# Patient Record
Sex: Female | Born: 1970 | Hispanic: No | Smoking: Former smoker
Health system: Southern US, Community
[De-identification: ages and names within clinical notes are randomized; demographics above are authoritative.]

## PROBLEM LIST (undated history)

## (undated) DIAGNOSIS — F419 Anxiety disorder, unspecified: Secondary | ICD-10-CM

## (undated) DIAGNOSIS — E119 Type 2 diabetes mellitus without complications: Secondary | ICD-10-CM

## (undated) DIAGNOSIS — R011 Cardiac murmur, unspecified: Secondary | ICD-10-CM

## (undated) DIAGNOSIS — R609 Edema, unspecified: Secondary | ICD-10-CM

## (undated) DIAGNOSIS — E785 Hyperlipidemia, unspecified: Secondary | ICD-10-CM

## (undated) DIAGNOSIS — D219 Benign neoplasm of connective and other soft tissue, unspecified: Secondary | ICD-10-CM

## (undated) DIAGNOSIS — D649 Anemia, unspecified: Secondary | ICD-10-CM

## (undated) HISTORY — PX: OVARIAN CYST REMOVAL: SHX89

## (undated) HISTORY — PX: WISDOM TOOTH EXTRACTION: SHX21

## (undated) HISTORY — DX: Hyperlipidemia, unspecified: E78.5

## (undated) HISTORY — DX: Benign neoplasm of connective and other soft tissue, unspecified: D21.9

## (undated) HISTORY — DX: Type 2 diabetes mellitus without complications: E11.9

---

## 2011-05-12 ENCOUNTER — Other Ambulatory Visit (HOSPITAL_COMMUNITY): Payer: Self-pay | Admitting: Obstetrics & Gynecology

## 2011-05-12 DIAGNOSIS — Z1231 Encounter for screening mammogram for malignant neoplasm of breast: Secondary | ICD-10-CM

## 2011-05-14 ENCOUNTER — Ambulatory Visit (HOSPITAL_COMMUNITY)
Admission: RE | Admit: 2011-05-14 | Discharge: 2011-05-14 | Disposition: A | Discharge status: Home / Self Care | Payer: Self-pay | Source: Ambulatory Visit | Attending: Obstetrics & Gynecology | Admitting: Obstetrics & Gynecology

## 2011-05-14 DIAGNOSIS — Z1231 Encounter for screening mammogram for malignant neoplasm of breast: Secondary | ICD-10-CM

## 2011-05-18 ENCOUNTER — Other Ambulatory Visit: Payer: Self-pay | Admitting: Obstetrics & Gynecology

## 2011-05-18 DIAGNOSIS — R928 Other abnormal and inconclusive findings on diagnostic imaging of breast: Secondary | ICD-10-CM

## 2011-05-19 ENCOUNTER — Telehealth: Payer: Self-pay | Admitting: *Deleted

## 2011-05-19 NOTE — Telephone Encounter (Signed)
Patient phoned into office at approximately 3:45 pm.  Patient requested appointment.  Patient call was sent to me after patient had been verbally abusive to front office staff member, Sherry Ruffing.  I attempted to help patient with scheduling an appointment.  Patient states she was seen by Dr. Marice Potter at the cervical cancer screening on Monday at [facility].  Patient states Dr. Marice Potter told her to call this number to schedule an appointment and we would know exactly what to do.  Patient states she is having bleeding issues and Dr. Marice Potter told her she would need some blood work.  Both Antoinette and myself attempted to determine if the patient was to have the lab work drawn prior to the appointment or on the same day.  The patient kept repeating that Dr. Marice Potter told her to call this number and we would know what she needed.  Patient stated "You are no better than the one who was on the phone before.  Are you stupid too?  I don't understand why there is all this drama.  I'm doing what Dr. Marice Potter told me to do."  I was finally able to schedule the patient an appointment with Dr. Marice Potter.  I explained to the patient that if Dr. Marice Potter wanted her to have lab work that it could be drawn at that appointment.  Patient proceeds to state that she does not have insurance and that Dr. Marice Potter told her to let us know that.  I explained to the patient that she would be expected to make a payment of $20 at the time of the appointment and we could then give her an application to apply for charity care with the hospital.  I explained that she may qualify for a discount on the services she receives.  The patient questioned what would happen if she did not qualify for a discount and wanted to know if she would be responsible for the bill.  I explained that she would be responsible for the bill.  Patient asked what would happen if she couldn't pay the bill.  I explained she would be turned over to collections if she didn't pay the bill.  Patient wanted to  know if I could tell her how much the charge would be to be seen.  I explained the charges would depend on what the doctor ordered for her.  She asked how she was to know whether our rates were comparable to other providers.  I explained that our charges were the same as if she went to any other provider in the community and that we are not a free clinic.  Patient states that if she is turned over to collections she will just tell them that Dr. Marice Potter told her to call this number.  She then asked if I would send her an application to apply for charity care through the mail.  I explained that I would do that but could not promise that it would be processed by the time of her appointment.  I further explained that usually the applications are not processed until the patient has been seen and has a balance.  The patient stated "Well that makes me damned if I do and damned if I don't."  I confirmed patient's address and reinforced that an application would be sent to her address in the mail today.

## 2011-05-22 ENCOUNTER — Inpatient Hospital Stay (HOSPITAL_COMMUNITY)
Admission: AD | Admit: 2011-05-22 | Discharge: 2011-05-22 | Disposition: A | Discharge status: Home / Self Care | Payer: Self-pay | Source: Ambulatory Visit | Attending: Family Medicine | Admitting: Family Medicine

## 2011-05-22 ENCOUNTER — Encounter (HOSPITAL_COMMUNITY): Payer: Self-pay

## 2011-05-22 DIAGNOSIS — N920 Excessive and frequent menstruation with regular cycle: Secondary | ICD-10-CM | POA: Insufficient documentation

## 2011-05-22 LAB — CBC
HCT: 33.8 % — ABNORMAL LOW (ref 36.0–46.0)
Hemoglobin: 11.1 g/dL — ABNORMAL LOW (ref 12.0–15.0)
MCH: 27.3 pg (ref 26.0–34.0)
MCHC: 32.8 g/dL (ref 30.0–36.0)
MCV: 83.3 fL (ref 78.0–100.0)
RDW: 14.4 % (ref 11.5–15.5)

## 2011-05-22 LAB — POCT PREGNANCY, URINE: Preg Test, Ur: NEGATIVE

## 2011-05-22 MED ORDER — MEGESTROL ACETATE 40 MG PO TABS: 120.0000 mg | ORAL_TABLET | Freq: Every day | ORAL | Status: AC

## 2011-05-22 NOTE — Discharge Instructions (Signed)
Abnormal Uterine Bleeding Abnormal uterine bleeding can have many causes. Some cases are simply treated, while others are more serious. There are several kinds of bleeding that is considered abnormal, including:  Bleeding between periods.   Bleeding after sexual intercourse.   Spotting anytime in the menstrual cycle.   Bleeding heavier or more than normal.   Bleeding after menopause.  CAUSES  There are many causes of abnormal uterine bleeding. It can be present in teenagers, pregnant women, women during their reproductive years, and women who have reached menopause. Your caregiver will look for the more common causes depending on your age, signs, symptoms and your particular circumstance. Most cases are not serious and can be treated. Even the more serious causes, like cancer of the female organs, can be treated adequately if found in the early stages. That is why all types of bleeding should be evaluated and treated as soon as possible. DIAGNOSIS  Diagnosing the cause may take several kinds of tests. Your caregiver may:  Take a complete history of the type of bleeding.   Perform a complete physical exam and Pap smear.   Take an ultrasound on the abdomen showing a picture of the female organs and the pelvis.   Inject dye into the uterus and Fallopian tubes and X-ray them (hysterosalpingogram).   Place fluid in the uterus and do an ultrasound (sonohysterogrqphy).   Take a CT scan to examine the female organs and pelvis.   Take an MRI to examine the female organs and pelvis. There is no X-ray involved with this procedure.   Look inside the uterus with a telescope that has a light at the end (hysteroscopy).   Scrap the inside of the uterus to get tissue to examine (Dilatation and Curettage, D&C).   Look into the pelvis with a telescope that has a light at the end (laparoscopy). This is done through a very small cut (incision) in the abdomen.  TREATMENT  Treatment will depend on the  cause of the abnormal bleeding. It can include:  Doing nothing to allow the problem to take care of itself over time.   Hormone treatment.   Birth control pills.   Treating the medical condition causing the problem.   Laparoscopy.   Major or minor surgery   Destroying the lining of the uterus with electrical currant, laser, freezing or heat (uterine ablation).  HOME CARE INSTRUCTIONS   Follow your caregiver's recommendation on how to treat your problem.   See your caregiver if you missed a menstrual period and think you may be pregnant.   If you are bleeding heavily, count the number of pads/tampons you use and how often you have to change them. Tell this to your caregiver.   Avoid sexual intercourse until the problem is controlled.  SEEK MEDICAL CARE IF:   You have any kind of abnormal bleeding mentioned above.   You feel dizzy at times.   You are 41 years old and have not had a menstrual period yet.  SEEK IMMEDIATE MEDICAL CARE IF:   You pass out.   You are changing pads/tampons every 15 to 30 minutes.   You have belly (abdominal) pain.   You have a temperature of 100 F (37.8 C) or higher.   You become sweaty or weak.   You are passing large blood clots from the vagina.   You start to feel sick to your stomach (nauseous) and throw up (vomit).  Document Released: 02/22/2005 Document Revised: 02/11/2011 Document Reviewed: 07/18/2008 Exit[employer]   Patient Information 2012 [employer]. 

## 2011-05-22 NOTE — ED Provider Notes (Signed)
@  MAUPATCONTACT@  Chief Complaint:  Vaginal Bleeding   Tina Sloan is  41 y.o. G2P0020.  Patient's last menstrual period was 04/24/2011.Marland Kitchen  Her pregnancy status is negative.  She presents complaining of Vaginal Bleeding . Onset is described as sudden and has been present for  2 days.  Pt has been having very heavy periods for over a year, but this one is "especially heavy".  She received a PAP test a few weeks ago and [facility]   Past Medical History  Diagnosis Date  . Endometriosis     Past Surgical History  Procedure Date  . Ovarian cyst removal     No family history on file.  History  Substance Use Topics  . Smoking status: Not on file  . Smokeless tobacco: Not on file  . Alcohol Use: Not on file    Allergies: No Known Allergies  Prescriptions prior to admission  Medication Sig Dispense Refill  . Multiple Vitamins-Minerals (MULTIVITAMIN) LIQD Take 1 scoop by mouth daily.      . naproxen (NAPROSYN) 500 MG tablet Take 500 mg by mouth 2 (two) times daily as needed. Pain associated with period        Review of Systems - Negative except heavy periods  Physical Exam   Blood pressure 141/91, pulse 86, temperature 98.8 F (37.1 C), temperature source Oral, resp. rate 18, last menstrual period 04/24/2011.  General: General appearance - alert, well appearing, and in no distress Chest - clear to auscultation, no wheezes, rales or rhonchi, symmetric air entry Heart - normal rate and regular rhythm Abdomen - soft, nontender, nondistended, no masses or organomegaly Pelvic - normal external genitalia, vulva, vagina, cervix.  Moderate amount dark blood in the vagina, cervix no-friable Extremities - no pedal edema noted   Labs: Recent Results (from the past 24 hour(s))  POCT PREGNANCY, URINE   Collection Time   05/22/11  2:07 PM      Component Value Range   Preg Test, Ur NEGATIVE  NEGATIVE   CBC   Collection Time   05/22/11  3:32 PM      Component Value Range   WBC 9.2  4.0 - 10.5 (K/uL)   RBC 4.06  3.87 - 5.11 (MIL/uL)   Hemoglobin 11.1 (*) 12.0 - 15.0 (g/dL)   HCT 04.5 (*) 40.9 - 46.0 (%)   MCV 83.3  78.0 - 100.0 (fL)   MCH 27.3  26.0 - 34.0 (pg)   MCHC 32.8  30.0 - 36.0 (g/dL)   RDW 81.1  91.4 - 78.2 (%)   Platelets 265  150 - 400 (K/uL)     Assessment: There is no problem list on file for this patient. Metromennorhagia  Plan: Pt doesn't want to wait for an ultrasound, so it will be ordered outpatient and f/u with GYN clinic  CRESENZO-DISHMAN,Mayline Dragon

## 2011-05-22 NOTE — MAU Note (Signed)
Patient has been having abnormal periods for one year, started her period yesterday bleeding heavy and passing clots has went through many pads in 24 hours,history of ovarian cyst with endometriosis [facility] was treated for trich, went for cancer screening pap at [facility] was seen for mammogram and has to have a screening of right breast.

## 2011-05-22 NOTE — ED Provider Notes (Signed)
Chart reviewed and agree with management and plan.  

## 2011-05-24 ENCOUNTER — Other Ambulatory Visit: Payer: Self-pay

## 2011-06-15 ENCOUNTER — Ambulatory Visit (INDEPENDENT_AMBULATORY_CARE_PROVIDER_SITE_OTHER): Payer: Self-pay | Admitting: *Deleted

## 2011-06-15 VITALS — BP 158/100 | HR 88 | Temp 98.0°F | Ht 63.5 in | Wt 164.5 lb

## 2011-06-15 DIAGNOSIS — Z1239 Encounter for other screening for malignant neoplasm of breast: Secondary | ICD-10-CM

## 2011-06-15 NOTE — Patient Instructions (Signed)
Taught patient how to perform BSE and gave educational materials to take home. Patient did not need a Pap smear today due to last Pap smear was 05/17/11. Patient has already been treated for the Trichomonas infection by [facility]. Patient referred to the [facility] for right breat diagnostic mammogram and possible right breast ultrasound. Appointment scheduled Thursday, June 17, 2011 at 1440. Let patient know will follow up with her within the next couple weeks with results. Patient verbalized understanding.

## 2011-06-15 NOTE — Progress Notes (Signed)
Patient referred from the [facility] due to needing additional imaging of the right breast. Screening mammogram was completed 05/14/11.  Pap Smear: Pap smear not performed today. Patients last Pap smear was 05/17/11 at the free Pap smear screening at [facility] by the [facility]. Pap smear was normal showing the Trichomonas infection. Per patient she has no history of abnormal Pap smears. No Pap smear results in EPIC. Patient has already been treated for Trich[facility] by [facility].  Physical exam:  Breasts  Breasts symmetrical. No skin abnormalities bilateral breasts. No nipple retraction bilateral breasts. No nipple discharge bilateral breasts. No lymphadenopathy. No lumps palpated bilateral breasts. No complaints of pain or tenderness on exam. Patient referred to the [facility] for right breast diagnostic mammogram and possible right breast ultrasound. Appointment scheduled for Thursday, June 17, 2011 at 1440.  Pelvic/Bimanual  No Pap smear completed today since last Pap smear was 05/17/11. Pap smear not indicated per BCCCP guidelines.

## 2011-06-17 ENCOUNTER — Ambulatory Visit
Admission: RE | Admit: 2011-06-17 | Discharge: 2011-06-17 | Disposition: A | Discharge status: Home / Self Care | Payer: No Typology Code available for payment source | Source: Ambulatory Visit | Attending: Obstetrics & Gynecology | Admitting: Obstetrics & Gynecology

## 2011-06-17 DIAGNOSIS — R928 Other abnormal and inconclusive findings on diagnostic imaging of breast: Secondary | ICD-10-CM

## 2011-06-25 ENCOUNTER — Ambulatory Visit: Payer: Self-pay | Admitting: Obstetrics & Gynecology

## 2011-07-06 ENCOUNTER — Telehealth: Payer: Self-pay

## 2011-07-06 NOTE — Telephone Encounter (Signed)
Called pt and left message to return call to Fonnie Mu @ [phone].

## 2011-07-07 ENCOUNTER — Telehealth: Payer: Self-pay | Admitting: *Deleted

## 2011-07-07 NOTE — Telephone Encounter (Signed)
Patient returned my phone call. Verified that patient has received her results to her diagnostic mammogram. Patient is aware that it was negative. Let her know she will need a screening mammogram in 1 year. Let patient know if still eligible for BCCCP can call next year to set up appointment. Patient verbalized understanding.

## 2011-07-22 ENCOUNTER — Ambulatory Visit: Payer: Self-pay | Admitting: Obstetrics & Gynecology

## 2011-07-29 ENCOUNTER — Ambulatory Visit: Payer: Self-pay | Admitting: Obstetrics & Gynecology

## 2012-06-12 ENCOUNTER — Other Ambulatory Visit: Payer: Self-pay | Admitting: Obstetrics and Gynecology

## 2012-06-12 DIAGNOSIS — Z1231 Encounter for screening mammogram for malignant neoplasm of breast: Secondary | ICD-10-CM

## 2012-06-20 ENCOUNTER — Encounter (HOSPITAL_COMMUNITY): Payer: Self-pay

## 2012-06-20 ENCOUNTER — Ambulatory Visit (HOSPITAL_COMMUNITY)
Admission: RE | Admit: 2012-06-20 | Discharge: 2012-06-20 | Disposition: A | Discharge status: Home / Self Care | Payer: Self-pay | Source: Ambulatory Visit | Attending: Obstetrics and Gynecology | Admitting: Obstetrics and Gynecology

## 2012-06-20 VITALS — BP 110/74 | Temp 98.3°F | Ht 64.0 in | Wt 151.6 lb

## 2012-06-20 DIAGNOSIS — Z1231 Encounter for screening mammogram for malignant neoplasm of breast: Secondary | ICD-10-CM

## 2012-06-20 DIAGNOSIS — Z1239 Encounter for other screening for malignant neoplasm of breast: Secondary | ICD-10-CM

## 2012-06-20 NOTE — Progress Notes (Addendum)
Complaints of bilateral breast tenderness.  Pap Smear:  Pap smear not performed today. Patients last Pap smear was 05/17/2011 at the free Cervical Cancer Screening at [facility] and normal. Per patient she has no history of an abnormal Pap smear. No Pap smear results in EPIC.  Physical exam: Breasts Breasts symmetrical. No skin abnormalities bilateral breasts. No nipple retraction bilateral breasts. No nipple discharge bilateral breasts. No lymphadenopathy. No lumps palpated bilateral breasts. No complaints of pain or tenderness on exam. Patient escorted to mammography for a screening mammogram.         Pelvic/Bimanual No Pap smear completed today since last Pap smear was 05/17/2011 and normal. Pap smear not indicated per BCCCP guidelines.

## 2012-06-20 NOTE — Patient Instructions (Signed)
Reviewed with patient how to perform BSE. Patient did not need a Pap smear today due to last Pap smear was 05/17/2011. Told patient about free cervical cancer screenings to receive a Pap smear if would like one next year. Let her know BCCCP will cover Pap smears every 3 years unless has a history of abnormal Pap smears. Patient complained of AUB. Gave her phone number to [facility] to call to schedule an appointment. Patient preferred to call instead of [place] referring. Let patient know will follow up with her within the next couple weeks with results by letter or phone for mammogram. Patient verbalized understanding.

## 2013-06-05 ENCOUNTER — Other Ambulatory Visit (HOSPITAL_COMMUNITY): Payer: Self-pay | Admitting: Obstetrics and Gynecology

## 2013-06-05 DIAGNOSIS — Z1231 Encounter for screening mammogram for malignant neoplasm of breast: Secondary | ICD-10-CM

## 2013-06-21 ENCOUNTER — Ambulatory Visit (HOSPITAL_COMMUNITY): Payer: Self-pay

## 2013-06-22 ENCOUNTER — Ambulatory Visit (HOSPITAL_COMMUNITY): Payer: Self-pay

## 2013-06-25 ENCOUNTER — Ambulatory Visit (HOSPITAL_COMMUNITY): Admission: RE | Admit: 2013-06-25 | Payer: Self-pay | Source: Ambulatory Visit

## 2013-06-26 ENCOUNTER — Ambulatory Visit (HOSPITAL_COMMUNITY)
Admission: RE | Admit: 2013-06-26 | Discharge: 2013-06-26 | Disposition: A | Discharge status: Home / Self Care | Payer: No Typology Code available for payment source | Source: Ambulatory Visit | Attending: Obstetrics and Gynecology | Admitting: Obstetrics and Gynecology

## 2013-06-26 DIAGNOSIS — Z1231 Encounter for screening mammogram for malignant neoplasm of breast: Secondary | ICD-10-CM | POA: Insufficient documentation

## 2013-10-12 ENCOUNTER — Other Ambulatory Visit: Payer: No Typology Code available for payment source

## 2013-10-12 ENCOUNTER — Ambulatory Visit
Admission: RE | Admit: 2013-10-12 | Discharge: 2013-10-12 | Disposition: A | Discharge status: Home / Self Care | Payer: No Typology Code available for payment source | Source: Ambulatory Visit | Attending: Obstetrics & Gynecology | Admitting: Obstetrics & Gynecology

## 2013-10-12 ENCOUNTER — Other Ambulatory Visit: Payer: Self-pay | Admitting: Obstetrics & Gynecology

## 2013-10-12 DIAGNOSIS — T8332XS Displacement of intrauterine contraceptive device, sequela: Secondary | ICD-10-CM

## 2013-10-12 DIAGNOSIS — R102 Pelvic and perineal pain: Secondary | ICD-10-CM

## 2013-10-12 DIAGNOSIS — R109 Unspecified abdominal pain: Secondary | ICD-10-CM

## 2014-01-07 ENCOUNTER — Encounter (HOSPITAL_COMMUNITY): Payer: Self-pay

## 2014-04-18 ENCOUNTER — Telehealth: Payer: Self-pay | Admitting: Cardiology

## 2014-04-18 NOTE — Telephone Encounter (Signed)
Received records from [facility] for appointment with Dr Percival Spanish on 04/26/14.  Records given to [facility] (medical records) for Dr Hochrein's schedule on 04/26/14. lp

## 2014-04-26 ENCOUNTER — Ambulatory Visit (INDEPENDENT_AMBULATORY_CARE_PROVIDER_SITE_OTHER): Payer: 59 | Admitting: Cardiology

## 2014-04-26 ENCOUNTER — Encounter: Payer: Self-pay | Admitting: Cardiology

## 2014-04-26 VITALS — BP 148/98 | HR 68 | Ht 63.0 in | Wt 159.0 lb

## 2014-04-26 DIAGNOSIS — R011 Cardiac murmur, unspecified: Secondary | ICD-10-CM

## 2014-04-26 NOTE — Progress Notes (Addendum)
Cardiology Office Note   Date:  04/26/2014   ID:  Garima Sloan, DOB 11/16/1970, MRN [id number]  PCP:  No primary care provider on file.  Cardiologist:   Minus Breeding, MD   Murmur    History of Present Illness: Tina Sloan is a 44 y.o. female who presents for evaluation of a heart murmur. She also has some fluid retention. She is to undergo surgery for some GYN issues and this is a preoperative evaluation. She was noted preoperatively to have heart murmur. She has no prior cardiac history though she does have a family history of early heart disease. She did have diabetes at one point in time we treated this with 25 pounds of weight loss. She has significant discomfort related to GYN issues. She does report that she retains fluid and her weight can fluctuate by 3 or 4 pounds in a day. However, she does not have dyspnea. She does not describe PND or orthopnea. She does not have chest pressure, neck or arm discomfort.     Past Medical History  Diagnosis Date  . Endometriosis   . Hyperlipidemia     Past Surgical History  Procedure Laterality Date  . Ovarian cyst removal       Current Outpatient Prescriptions  Medication Sig Dispense Refill  . Biotin 1000 MCG tablet Take 1,000 mcg by mouth 3 (three) times daily.    . Cyanocobalamin (VITAMIN B 12 PO) Take by mouth.    . Iron-Vitamin C (IRON 100/C PO) Take 100 mg by mouth.    Marland Kitchen VITAMIN D, CHOLECALCIFEROL, PO Take by mouth.    Marland Kitchen ibuprofen (ADVIL,MOTRIN) 600 MG tablet 600 mg.  2  . Multiple Vitamins-Minerals (MULTIVITAMIN) LIQD Take 1 scoop by mouth daily.    . naproxen (NAPROSYN) 500 MG tablet Take 500 mg by mouth 2 (two) times daily as needed. Pain associated with period     No current facility-administered medications for this visit.    Allergies:   Review of patient's allergies indicates no known allergies.    Social History:  The patient  reports that she has never smoked. She has never used smokeless tobacco. She  reports that she does not drink alcohol or use illicit drugs.   Family History:  The patient's family history includes Diabetes in her father and mother; Heart disease in her father; Hypertension in her father and mother.    ROS:  Please see the history of present illness.   Otherwise, review of systems are positive for none.   All other systems are reviewed and negative.    PHYSICAL EXAM: VS:  Ht 5\' 3"  (1.6 m)  Wt 159 lb (72.122 kg)  BMI 28.17 kg/m2 , BMI Body mass index is 28.17 kg/(m^2). GENERAL:  Well appearing HEENT:  Pupils equal round and reactive, fundi not visualized, oral mucosa unremarkable NECK:  No jugular venous distention, waveform within normal limits, carotid upstroke brisk and symmetric, no bruits, no thyromegaly LYMPHATICS:  No cervical, inguinal adenopathy LUNGS:  Clear to auscultation bilaterally BACK:  No CVA tenderness CHEST:  Unremarkable HEART:  PMI not displaced or sustained,S1 and S2 within normal limits, no S3, no S4, no clicks, no rubs, 2 out of 6 left upper sternal border early systolic murmur not increasing with the strain phase of Valsalva, no diastolic murmurs ABD:  Flat, positive bowel sounds normal in frequency in pitch, no bruits, no rebound, no guarding, no midline pulsatile mass, no hepatomegaly, no splenomegaly EXT:  2 plus pulses throughout,  no edema, no cyanosis no clubbing SKIN:  No rashes no nodules NEURO:  Cranial nerves II through XII grossly intact, motor grossly intact throughout PSYCH:  Cognitively intact, oriented to person place and time    EKG:  EKG is ordered today. The ekg ordered today demonstrates sinus rhythm, rate 68, axis within normal limits, intervals within normal limits, no acute ST-T wave changes.   Recent Labs: No results found for requested labs within last 365 days.    Lipid Panel No results found for: CHOL, TRIG, HDL, CHOLHDL, VLDL, LDLCALC, LDLDIRECT    Wt Readings from Last 3 Encounters:  04/26/14 159 lb  (72.122 kg)  06/15/11 164 lb 8 oz (74.617 kg)      Other studies Reviewed: Additional studies/ records that were reviewed today include:  Outside records. Review of the above records demonstrates:  Please see elsewhere in the note.   ASSESSMENT AND PLAN:  MURMUR:  I suspect a flow murmur but I will check an echocardiogram. Further evaluation will be based on the results of this.  PALPITATIONS:  She has some rare isolated skipped beats. I suspect PACs or PVCs. We discussed these. No change in therapy is indicated.  HTN:  Her blood pressure is mildly elevated but she says that this happens when she becomes anxious. We talked about therapeutic lifestyle changes to include exercise and salt restriction.  VOLUME RETENTION:  I do not suspect any heart failure. We did talk about salt restriction. This may be related to her GYN issues. No further evaluation is indicated other than the echo as above.   Current medicines are reviewed at length with the patient today.  The patient does not have concerns regarding medicines.  The following changes have been made:  no change  Labs/ tests ordered today include: echo Orders Placed This Encounter  Procedures  . EKG 12-Lead     Disposition:   FU with me as needed.     Signed, Minus Breeding, MD  04/26/2014 11:53 AM    [facility]

## 2014-04-26 NOTE — Patient Instructions (Signed)
Your physician recommends that you schedule a follow-up appointment in: as needed with Dr. Percival Spanish  We are ordering an echo for you to get done

## 2014-04-29 ENCOUNTER — Inpatient Hospital Stay (HOSPITAL_COMMUNITY): Admission: RE | Admit: 2014-04-29 | Payer: 59 | Source: Ambulatory Visit

## 2014-05-22 ENCOUNTER — Inpatient Hospital Stay (HOSPITAL_COMMUNITY): Admission: RE | Admit: 2014-05-22 | Payer: 59 | Source: Ambulatory Visit

## 2014-06-11 ENCOUNTER — Other Ambulatory Visit (HOSPITAL_COMMUNITY): Payer: Self-pay | Admitting: Internal Medicine

## 2014-06-11 DIAGNOSIS — Z1231 Encounter for screening mammogram for malignant neoplasm of breast: Secondary | ICD-10-CM

## 2014-06-28 ENCOUNTER — Ambulatory Visit (HOSPITAL_COMMUNITY): Payer: 59

## 2014-07-05 ENCOUNTER — Other Ambulatory Visit: Payer: Self-pay | Admitting: Obstetrics & Gynecology

## 2014-07-05 DIAGNOSIS — D259 Leiomyoma of uterus, unspecified: Secondary | ICD-10-CM

## 2014-07-09 ENCOUNTER — Other Ambulatory Visit: Payer: 59

## 2014-07-12 ENCOUNTER — Ambulatory Visit (HOSPITAL_COMMUNITY)
Admission: RE | Admit: 2014-07-12 | Discharge: 2014-07-12 | Disposition: A | Discharge status: Home / Self Care | Payer: 59 | Source: Ambulatory Visit | Attending: Internal Medicine | Admitting: Internal Medicine

## 2014-07-12 DIAGNOSIS — Z1231 Encounter for screening mammogram for malignant neoplasm of breast: Secondary | ICD-10-CM | POA: Insufficient documentation

## 2015-01-31 ENCOUNTER — Ambulatory Visit (HOSPITAL_COMMUNITY): Payer: 59

## 2015-07-03 ENCOUNTER — Encounter (HOSPITAL_COMMUNITY): Payer: Self-pay

## 2015-07-03 ENCOUNTER — Inpatient Hospital Stay (HOSPITAL_COMMUNITY): Payer: BLUE CROSS/BLUE SHIELD

## 2015-07-03 ENCOUNTER — Inpatient Hospital Stay (HOSPITAL_COMMUNITY)
Admission: AD | Admit: 2015-07-03 | Discharge: 2015-07-03 | Disposition: A | Discharge status: Home / Self Care | Payer: BLUE CROSS/BLUE SHIELD | Source: Ambulatory Visit | Attending: Obstetrics & Gynecology | Admitting: Obstetrics & Gynecology

## 2015-07-03 DIAGNOSIS — I10 Essential (primary) hypertension: Secondary | ICD-10-CM | POA: Diagnosis not present

## 2015-07-03 DIAGNOSIS — N390 Urinary tract infection, site not specified: Secondary | ICD-10-CM

## 2015-07-03 DIAGNOSIS — M545 Low back pain: Secondary | ICD-10-CM | POA: Diagnosis not present

## 2015-07-03 DIAGNOSIS — N289 Disorder of kidney and ureter, unspecified: Secondary | ICD-10-CM | POA: Diagnosis not present

## 2015-07-03 DIAGNOSIS — D259 Leiomyoma of uterus, unspecified: Secondary | ICD-10-CM | POA: Insufficient documentation

## 2015-07-03 DIAGNOSIS — R1030 Lower abdominal pain, unspecified: Secondary | ICD-10-CM | POA: Insufficient documentation

## 2015-07-03 DIAGNOSIS — N809 Endometriosis, unspecified: Secondary | ICD-10-CM | POA: Insufficient documentation

## 2015-07-03 DIAGNOSIS — R809 Proteinuria, unspecified: Secondary | ICD-10-CM | POA: Insufficient documentation

## 2015-07-03 DIAGNOSIS — R319 Hematuria, unspecified: Secondary | ICD-10-CM | POA: Diagnosis not present

## 2015-07-03 DIAGNOSIS — Z87891 Personal history of nicotine dependence: Secondary | ICD-10-CM | POA: Insufficient documentation

## 2015-07-03 DIAGNOSIS — R109 Unspecified abdominal pain: Secondary | ICD-10-CM

## 2015-07-03 LAB — URINALYSIS, ROUTINE W REFLEX MICROSCOPIC
Bilirubin Urine: NEGATIVE
GLUCOSE, UA: NEGATIVE mg/dL
Ketones, ur: NEGATIVE mg/dL
Leukocytes, UA: NEGATIVE
Nitrite: NEGATIVE
Protein, ur: >300 mg/dL — AB
SPECIFIC GRAVITY, URINE: 1.015 (ref 1.005–1.030)
pH: 6 (ref 5.0–8.0)

## 2015-07-03 LAB — URINE MICROSCOPIC-ADD ON
BACTERIA UA: NONE SEEN
WBC, UA: NONE SEEN WBC/hpf (ref 0–5)

## 2015-07-03 MED ORDER — HYDROCHLOROTHIAZIDE 25 MG PO TABS: 25.0000 mg | ORAL_TABLET | Freq: Every day | ORAL | Status: DC

## 2015-07-03 MED ORDER — KETOROLAC TROMETHAMINE 60 MG/2ML IM SOLN
60.0000 mg | Freq: Once | INTRAMUSCULAR | Status: AC
  Administered 2015-07-03: 60 mg via INTRAMUSCULAR
  Filled 2015-07-03: qty 2

## 2015-07-03 MED ORDER — IBUPROFEN 600 MG PO TABS: 600.0000 mg | ORAL_TABLET | Freq: Four times a day (QID) | ORAL | Status: DC | PRN

## 2015-07-03 MED ORDER — SULFAMETHOXAZOLE-TRIMETHOPRIM 800-160 MG PO TABS: 1.0000 | ORAL_TABLET | Freq: Two times a day (BID) | ORAL | Status: DC

## 2015-07-03 NOTE — MAU Note (Signed)
Rt flank pain , started on Saturday, sharp pains, getting worse, comes around to the front.  Period ended on Saturday, but started spotting yesterday.

## 2015-07-03 NOTE — MAU Note (Signed)
Urine sent to lab 

## 2015-07-03 NOTE — MAU Provider Note (Signed)
Chief Complaint:  Flank Pain   First Provider Initiated Contact with Patient 07/03/15 1525      HPI  Tina Sloan is a 45 y.o. [id number] who presents to maternity admissions reporting right low back pain which wraps around to the front. Denies dysuria or fever. Thinks it is a kidney stone and wants a CT scan.  Just got insurance and plans care with [facility] but has not been there yet.  Does not have a primary doctor. She reports no vaginal bleeding, vaginal itching/burning, h/a, dizziness, n/v, or fever/chills.    History is remarkable for labile hypertension, fibroids, and endometriosis.States was in a fibroid study and they told her she has 4.  Was supposed to have surgery with [facility] but "chickened out" and never went back to office. States was in a hypertension study and they found she really did not have hypertension.  RN Note: Rt flank pain , started on Saturday, sharp pains, getting worse, comes around to the front. Period ended on Saturday, but started spotting yesterday.           Past Medical History: Past Medical History  Diagnosis Date  . Endometriosis   . Hyperlipidemia   . Fibroids   . Diabetes     Controlled with loss    Past obstetric history: OB History  Gravida Para Term Preterm AB SAB TAB Ectopic Multiple Living  2    2  2        # Outcome Date GA Lbr Len/2nd Weight Sex Delivery Anes PTL Lv  2 TAB           1 TAB               Past Surgical History: Past Surgical History  Procedure Laterality Date  . Ovarian cyst removal      Family History: Family History  Problem Relation Age of Onset  . Diabetes Father   . Heart disease Father     CABG, died age 35  . Hypertension Father   . Diabetes Mother   . Hypertension Mother     Social History: Social History  Substance Use Topics  . Smoking status: Former Smoker    Types: Cigarettes  . Smokeless tobacco: Never Used     Comment: Quit tobacco 2012.    Marland Kitchen Alcohol Use: No    Allergies:  No Known Allergies  Meds:  Prescriptions prior to admission  Medication Sig Dispense Refill Last Dose  . Biotin 1000 MCG tablet Take 1,000 mcg by mouth 3 (three) times daily.   Taking  . Cyanocobalamin (VITAMIN B 12 PO) Take by mouth.   Taking  . ibuprofen (ADVIL,MOTRIN) 600 MG tablet 600 mg.  2   . Iron-Vitamin C (IRON 100/C PO) Take 100 mg by mouth.   Taking  . Multiple Vitamins-Minerals (MULTIVITAMIN) LIQD Take 1 scoop by mouth daily.   Not Taking  . naproxen (NAPROSYN) 500 MG tablet Take 500 mg by mouth 2 (two) times daily as needed. Pain associated with period   Not Taking  . VITAMIN D, CHOLECALCIFEROL, PO Take by mouth.   Taking    I have reviewed patient's Past Medical Hx, Surgical Hx, Family Hx, Social Hx, medications and allergies.  ROS:  Review of Systems  Constitutional: Negative for fever, chills and fatigue.  Respiratory: Negative for shortness of breath.   Cardiovascular: Positive for leg swelling.  Gastrointestinal: Positive for abdominal pain. Negative for nausea, vomiting, diarrhea and constipation.  Genitourinary: Positive for flank pain,  menstrual problem and pelvic pain. Negative for dysuria, frequency, hematuria, vaginal bleeding, vaginal discharge and difficulty urinating.  Musculoskeletal: Positive for back pain. Negative for myalgias.  Neurological: Negative for dizziness and light-headedness.   Other systems negative  Physical Exam   Constitutional: Well-developed, well-nourished female in no acute distress.  Cardiovascular: normal rate and rhythm, no ectopy audible, S1 & S2 heard, no murmur Respiratory: normal effort, no distress. Lungs CTAB with no wheezes or crackles GI: Abd soft, moderately tender over RLQ and right lower back.  Nondistended.  No rebound, No guarding.  Bowel Sounds audible  MS: Extremities nontender, no edema, normal ROM Neurologic: Alert and oriented x 4.   Grossly nonfocal. GU: Neg CVAT. Skin:  Warm and Dry Psych:  Affect  appropriate.  PELVIC EXAM: Cervix pink, visually closed, without lesion, scant white creamy discharge, vaginal walls and external genitalia normal Bimanual exam: Cervix firm, anterior, neg CMT, uterus nontender, enlarged and firm, c/w fibroids, adnexa without tenderness, enlargement, or mass    Labs: Results for orders placed or performed during the hospital encounter of 07/03/15 (from the past 24 hour(s))  Urinalysis, Routine w reflex microscopic (not at [facility])     Status: Abnormal   Collection Time: 07/03/15  3:15 PM  Result Value Ref Range   Color, Urine YELLOW YELLOW   APPearance CLEAR CLEAR   Specific Gravity, Urine 1.015 1.005 - 1.030   pH 6.0 5.0 - 8.0   Glucose, UA NEGATIVE NEGATIVE mg/dL   Hgb urine dipstick LARGE (A) NEGATIVE   Bilirubin Urine NEGATIVE NEGATIVE   Ketones, ur NEGATIVE NEGATIVE mg/dL   Protein, ur >300 (A) NEGATIVE mg/dL   Nitrite NEGATIVE NEGATIVE   Leukocytes, UA NEGATIVE NEGATIVE  Urine microscopic-add on     Status: Abnormal   Collection Time: 07/03/15  3:15 PM  Result Value Ref Range   Squamous Epithelial / LPF 6-30 (A) NONE SEEN   WBC, UA NONE SEEN 0 - 5 WBC/hpf   RBC / HPF 0-5 0 - 5 RBC/hpf   Bacteria, UA NONE SEEN NONE SEEN    Imaging:  US Renal  07/03/2015  CLINICAL DATA:  Right flank pain for 5 days. EXAM: RENAL / URINARY TRACT ULTRASOUND COMPLETE COMPARISON:  None. FINDINGS: Right Kidney: Length: 10.7 cm. Normal right renal parenchymal echogenicity and thickness. No right hydronephrosis. Hyperechoic 0.5 x 0.4 x 0.6 cm renal cortical lesion in the interpolar right kidney. Left Kidney: Length: 12.7 cm. Echogenicity within normal limits. No mass or hydronephrosis visualized. Bladder: Appears normal for degree of bladder distention. Both ureteral jets are demonstrated in the bladder lumen. IMPRESSION: 1. No hydronephrosis. Both ureteral jets are seen in the bladder lumen. 2. Nonspecific tiny hyperechoic 0.6 cm renal cortical lesion in the interpolar  right kidney. 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 Recommend a follow-up renal ultrasound in 6-12 months to document stability. If this lesion enlarges on follow-up renal ultrasound, additional evaluation with MRI could be performed at that time. 3. Normal bladder. Electronically Signed   By: Ilona Sorrel M.D.   On: 07/03/2015 17:48    MAU Course/MDM: I have ordered labs as follows:  UA.  Refuses STD testing. States has not had sex "in a long time" Imaging ordered: Renal ultrasound to rule out stones Results reviewed.   There is hematuria and proteinuria. Urine to culture. Will treat presumptively for UTI.  Consult Dr Ihor Dow due to elevated BP.   Treatments in MAU  included Toradol for pain with good results.   Pt stable at time of discharge.  Assessment: Low back and low abdominal pain Probable UTI, given hematuria and proteinuria Hypertension Right renal lesion, tiny Known fibroid uterus Known endometriosis  Plan: Discharge home Recommend Find primary doctor to coordinate care and evaluate BP trends Rx sent for HCTZ  for hypertension for 30 days per Dr Ihor Dow Followup with OB/GYN for care as desired Push fluids Urine to culture     Medication List    ASK your doctor about these medications        Biotin 1000 MCG tablet  Take 1,000 mcg by mouth 3 (three) times daily.     ibuprofen 600 MG tablet  Commonly known as:  ADVIL,MOTRIN  600 mg.     IRON 100/C PO  Take 100 mg by mouth.     MULTIVITAMIN Liqd  Take 1 scoop by mouth daily.     naproxen 500 MG tablet  Commonly known as:  NAPROSYN  Take 500 mg by mouth 2 (two) times daily as needed. Pain associated with period     VITAMIN B 12 PO  Take by mouth.     VITAMIN D (CHOLECALCIFEROL) PO  Take by mouth.       Encouraged to return here or to other Urgent Care/ED if she develops worsening of symptoms, increase in pain, fever, or other  concerning symptoms.   Hansel Feinstein CNM, MSN Certified Nurse-Midwife 07/03/2015 3:26 PM

## 2015-07-03 NOTE — Discharge Instructions (Signed)
Hypertension Hypertension, commonly called high blood pressure, is when the force of blood pumping through your arteries is too strong. Your arteries are the blood vessels that carry blood from your heart throughout your body. A blood pressure reading consists of a higher number over a lower number, such as 110/72. The higher number (systolic) is the pressure inside your arteries when your heart pumps. The lower number (diastolic) is the pressure inside your arteries when your heart relaxes. Ideally you want your blood pressure below 120/80. Hypertension forces your heart to work harder to pump blood. Your arteries may become narrow or stiff. Having untreated or uncontrolled hypertension can cause heart attack, stroke, kidney disease, and other problems. RISK FACTORS Some risk factors for high blood pressure are controllable. Others are not.  Risk factors you cannot control include:   Race. You may be at higher risk if you are African American.  Age. Risk increases with age.  Gender. Men are at higher risk than women before age 45 years. After age 65, women are at higher risk than men. Risk factors you can control include:  Not getting enough exercise or physical activity.  Being overweight.  Getting too much fat, sugar, calories, or salt in your diet.  Drinking too much alcohol. SIGNS AND SYMPTOMS Hypertension does not usually cause signs or symptoms. Extremely high blood pressure (hypertensive crisis) may cause headache, anxiety, shortness of breath, and nosebleed. DIAGNOSIS To check if you have hypertension, your health care provider will measure your blood pressure while you are seated, with your arm held at the level of your heart. It should be measured at least twice using the same arm. Certain conditions can cause a difference in blood pressure between your right and left arms. A blood pressure reading that is higher than normal on one occasion does not mean that you need treatment. If  it is not clear whether you have high blood pressure, you may be asked to return on a different day to have your blood pressure checked again. Or, you may be asked to monitor your blood pressure at home for 1 or more weeks. TREATMENT Treating high blood pressure includes making lifestyle changes and possibly taking medicine. Living a healthy lifestyle can help lower high blood pressure. You may need to change some of your habits. Lifestyle changes may include:  Following the DASH diet. This diet is high in fruits, vegetables, and whole grains. It is low in salt, red meat, and added sugars.  Keep your sodium intake below 2,300 mg per day.  Getting at least 30-45 minutes of aerobic exercise at least 4 times per week.  Losing weight if necessary.  Not smoking.  Limiting alcoholic beverages.  Learning ways to reduce stress. Your health care provider may prescribe medicine if lifestyle changes are not enough to get your blood pressure under control, and if one of the following is true:  You are 18-59 years of age and your systolic blood pressure is above 140.  You are 60 years of age or older, and your systolic blood pressure is above 150.  Your diastolic blood pressure is above 90.  You have diabetes, and your systolic blood pressure is over 140 or your diastolic blood pressure is over 90.  You have kidney disease and your blood pressure is above 140/90.  You have heart disease and your blood pressure is above 140/90. Your personal target blood pressure may vary depending on your medical conditions, your age, and other factors. HOME CARE INSTRUCTIONS    Have your blood pressure rechecked as directed by your health care provider.   Take medicines only as directed by your health care provider. Follow the directions carefully. Blood pressure medicines must be taken as prescribed. The medicine does not work as well when you skip doses. Skipping doses also puts you at risk for  problems.  Do not smoke.   Monitor your blood pressure at home as directed by your health care provider. SEEK MEDICAL CARE IF:   You think you are having a reaction to medicines taken.  You have recurrent headaches or feel dizzy.  You have swelling in your ankles.  You have trouble with your vision. SEEK IMMEDIATE MEDICAL CARE IF:  You develop a severe headache or confusion.  You have unusual weakness, numbness, or feel faint.  You have severe chest or abdominal pain.  You vomit repeatedly.  You have trouble breathing. MAKE SURE YOU:   Understand these instructions.  Will watch your condition.  Will get help right away if you are not doing well or get worse.   This information is not intended to replace advice given to you by your health care provider. Make sure you discuss any questions you have with your health care provider.   Document Released: 02/22/2005 Document Revised: 07/09/2014 Document Reviewed: 12/15/2012 Elsevier Interactive Patient Education 2016 Elsevier Inc. Urinary Tract Infection Urinary tract infections (UTIs) can develop anywhere along your urinary tract. Your urinary tract is your body's drainage system for removing wastes and extra water. Your urinary tract includes two kidneys, two ureters, a bladder, and a urethra. Your kidneys are a pair of bean-shaped organs. Each kidney is about the size of your fist. They are located below your ribs, one on each side of your spine. CAUSES Infections are caused by microbes, which are microscopic organisms, including fungi, viruses, and bacteria. These organisms are so small that they can only be seen through a microscope. Bacteria are the microbes that most commonly cause UTIs. SYMPTOMS  Symptoms of UTIs may vary by age and gender of the patient and by the location of the infection. Symptoms in young women typically include a frequent and intense urge to urinate and a painful, burning feeling in the bladder or  urethra during urination. Older women and men are more likely to be tired, shaky, and weak and have muscle aches and abdominal pain. A fever may mean the infection is in your kidneys. Other symptoms of a kidney infection include pain in your back or sides below the ribs, nausea, and vomiting. DIAGNOSIS To diagnose a UTI, your caregiver will ask you about your symptoms. Your caregiver will also ask you to provide a urine sample. The urine sample will be tested for bacteria and white blood cells. White blood cells are made by your body to help fight infection. TREATMENT  Typically, UTIs can be treated with medication. Because most UTIs are caused by a bacterial infection, they usually can be treated with the use of antibiotics. The choice of antibiotic and length of treatment depend on your symptoms and the type of bacteria causing your infection. HOME CARE INSTRUCTIONS  If you were prescribed antibiotics, take them exactly as your caregiver instructs you. Finish the medication even if you feel better after you have only taken some of the medication.  Drink enough water and fluids to keep your urine clear or pale yellow.  Avoid caffeine, tea, and carbonated beverages. They tend to irritate your bladder.  Empty your bladder often. Avoid holding urine  for long periods of time.  Empty your bladder before and after sexual intercourse.  After a bowel movement, women should cleanse from front to back. Use each tissue only once. SEEK MEDICAL CARE IF:   You have back pain.  You develop a fever.  Your symptoms do not begin to resolve within 3 days. SEEK IMMEDIATE MEDICAL CARE IF:   You have severe back pain or lower abdominal pain.  You develop chills.  You have nausea or vomiting.  You have continued burning or discomfort with urination. MAKE SURE YOU:   Understand these instructions.  Will watch your condition.  Will get help right away if you are not doing well or get worse.   This  information is not intended to replace advice given to you by your health care provider. Make sure you discuss any questions you have with your health care provider.   Document Released: 12/02/2004 Document Revised: 11/13/2014 Document Reviewed: 04/02/2011 Elsevier Interactive Patient Education [employer].

## 2015-09-02 NOTE — Patient Instructions (Addendum)
Your procedure is scheduled on:  Monday, September 08, 2015  Enter through the Main Entrance of [facility] at: 6:00 AM  Pick up the phone at the desk and dial [phone].  Call this number if you have problems the morning of surgery: [phone].  Remember: Do NOT eat food or drink after:  Midnight Sunday  Take these medicines the morning of surgery with a SIP OF WATER:  None  Stop taking Omega Fish Oil at this time  Do NOT wear jewelry (body piercing), metal hair clips/bobby pins, make-up, or nail polish. Do NOT wear lotions, powders, or perfumes.  You may wear deodorant. Do NOT shave for 48 hours prior to surgery. Do NOT bring valuables to the hospital. Contacts, dentures, or bridgework may not be worn into surgery.  Leave suitcase in car.  After surgery it may be brought to your room.  For patients admitted to the hospital, checkout time is 11:00 AM the day of discharge.

## 2015-09-03 ENCOUNTER — Encounter (HOSPITAL_COMMUNITY)
Admission: RE | Admit: 2015-09-03 | Discharge: 2015-09-03 | Disposition: A | Discharge status: Home / Self Care | Payer: BLUE CROSS/BLUE SHIELD | Source: Ambulatory Visit | Attending: Obstetrics & Gynecology | Admitting: Obstetrics & Gynecology

## 2015-09-03 ENCOUNTER — Encounter (HOSPITAL_COMMUNITY): Payer: Self-pay

## 2015-09-03 DIAGNOSIS — Z01812 Encounter for preprocedural laboratory examination: Secondary | ICD-10-CM | POA: Diagnosis present

## 2015-09-03 HISTORY — DX: Anemia, unspecified: D64.9

## 2015-09-03 HISTORY — DX: Cardiac murmur, unspecified: R01.1

## 2015-09-03 HISTORY — DX: Edema, unspecified: R60.9

## 2015-09-03 NOTE — Pre-Procedure Instructions (Signed)
Dr. Marcell Barlow made aware of Ms. Binion glucose level, he states we will manage it day of surgery.

## 2015-09-04 NOTE — H&P (Signed)
Tina Sloan is an 45 y.o. female with irregular, heavy vaginal bleeding and dysmenorrhea which affects her 75% of the time.  She failed IUD placement years ago because of poor placement and declines other hormonal treatment.  She has no children but does not wish to maintain childbearing potential; she desires definitive management.  Ultrasound in the office showed uterus 400cc with 3.6 cm posterior, subserosal fibroid.    Pertinent Gynecological History: Menses: flow is excessive with use of 7 pads or tampons on heaviest days Bleeding: dysfunctional uterine bleeding Contraception: abstinence DES exposure: unknown Blood transfusions: none Sexually transmitted diseases: no past history Previous GYN Procedures: l/s ovarian cystectomy  Last mammogram: normal Date: 08/2015 Last pap: normal Date: 08/2015 OB History: G2, P0   Menstrual History: Menarche age: n/a No LMP recorded.    Past Medical History  Diagnosis Date  . Endometriosis   . Hyperlipidemia   . Fibroids   . Diabetes ([place])     Controlled with loss  . Heart murmur     slight  . Swelling     generalized water retention  . Anemia     Past Surgical History  Procedure Laterality Date  . Ovarian cyst removal      Family History  Problem Relation Age of Onset  . Diabetes Father   . Heart disease Father     CABG, died age 27  . Hypertension Father   . Diabetes Mother   . Hypertension Mother     Social History:  reports that she has quit smoking. Her smoking use included Cigarettes. She has never used smokeless tobacco. She reports that she does not drink alcohol or use illicit drugs.  Allergies: No Known Allergies  No prescriptions prior to admission    ROS  There were no vitals taken for this visit. Physical Exam  Constitutional: She is oriented to person, place, and time. She appears well-developed and well-nourished.  GI: Soft. There is no rebound and no guarding.  Neurological: She is alert and oriented  to person, place, and time.  Skin: Skin is warm and dry.  Psychiatric: She has a normal mood and affect. Her behavior is normal.    No results found for this or any previous visit (from the past 24 hour(s)).  No results found.  Assessment/Plan: 45yo with heavy, irregular vaginal bleeding desiring definitive management. -LAVH, b/l salpingectomy, possible TAH, b/l salpingectomy -Patient has been counseled re: risk of bleeding, infection, scarring and damage to surrounding structures.  She also understands the possibility of converting to abdominal procedure to complete successfully.  All questions were answered and the patient wishes to proceed.  Autie Vasudevan, [place] 09/04/2015, 8:49 PM

## 2015-09-05 NOTE — Anesthesia Preprocedure Evaluation (Addendum)
Anesthesia Evaluation  Patient identified by MRN, date of birth, ID band Patient awake    Reviewed: Allergy & Precautions, NPO status , Patient's Chart, lab work & pertinent test results  History of Anesthesia Complications (+) PONV  Airway Mallampati: II  TM Distance: >3 FB Neck ROM: Full    Dental no notable dental hx. (+) Teeth Intact   Pulmonary neg pulmonary ROS, former smoker,    Pulmonary exam normal breath sounds clear to auscultation- rhonchi       Cardiovascular negative cardio ROS Normal cardiovascular exam+ Valvular Problems/Murmurs  Rhythm:Regular Rate:Normal + Systolic murmurs EKG [id number] OK, Heart Murmur   Neuro/Psych negative neurological ROS  negative psych ROS   GI/Hepatic negative GI ROS, Neg liver ROS,   Endo/Other  diabetes, Poorly Controlled, Type 2DM reported controlled by weight loss and diet   Renal/GU negative Renal ROS   Menorrhagia      Musculoskeletal negative musculoskeletal ROS (+)   Abdominal   Peds negative pediatric ROS (+)  Hematology  (+) anemia ,   Anesthesia Other Findings   Reproductive/Obstetrics negative OB ROS                           Anesthesia Physical Anesthesia Plan  ASA: II  Anesthesia Plan: General   Post-op Pain Management:    Induction: Intravenous  Airway Management Planned: Oral ETT  Additional Equipment:   Intra-op Plan:   Post-operative Plan: Extubation in OR  Informed Consent: I have reviewed the patients History and Physical, chart, labs and discussed the procedure including the risks, benefits and alternatives for the proposed anesthesia with the patient or authorized representative who has indicated his/her understanding and acceptance.   Dental advisory given  Plan Discussed with: CRNA, Anesthesiologist and Surgeon  Anesthesia Plan Comments:        Anesthesia Quick Evaluation

## 2015-09-08 ENCOUNTER — Observation Stay (HOSPITAL_COMMUNITY)
Admission: RE | Admit: 2015-09-08 | Discharge: 2015-09-10 | Disposition: A | Discharge status: Home / Self Care | Payer: BLUE CROSS/BLUE SHIELD | Source: Ambulatory Visit | Attending: Obstetrics & Gynecology | Admitting: Obstetrics & Gynecology

## 2015-09-08 ENCOUNTER — Encounter (HOSPITAL_COMMUNITY)
Admission: RE | Disposition: A | Discharge status: Home / Self Care | Payer: Self-pay | Source: Ambulatory Visit | Attending: Obstetrics & Gynecology

## 2015-09-08 ENCOUNTER — Ambulatory Visit (HOSPITAL_COMMUNITY): Payer: BLUE CROSS/BLUE SHIELD | Admitting: Anesthesiology

## 2015-09-08 ENCOUNTER — Encounter (HOSPITAL_COMMUNITY): Payer: Self-pay | Admitting: *Deleted

## 2015-09-08 DIAGNOSIS — N939 Abnormal uterine and vaginal bleeding, unspecified: Principal | ICD-10-CM | POA: Insufficient documentation

## 2015-09-08 DIAGNOSIS — N946 Dysmenorrhea, unspecified: Secondary | ICD-10-CM | POA: Diagnosis not present

## 2015-09-08 DIAGNOSIS — Z87891 Personal history of nicotine dependence: Secondary | ICD-10-CM | POA: Insufficient documentation

## 2015-09-08 DIAGNOSIS — N800 Endometriosis of uterus: Secondary | ICD-10-CM | POA: Diagnosis not present

## 2015-09-08 DIAGNOSIS — D251 Intramural leiomyoma of uterus: Secondary | ICD-10-CM | POA: Insufficient documentation

## 2015-09-08 DIAGNOSIS — E039 Hypothyroidism, unspecified: Secondary | ICD-10-CM | POA: Insufficient documentation

## 2015-09-08 DIAGNOSIS — E119 Type 2 diabetes mellitus without complications: Secondary | ICD-10-CM | POA: Insufficient documentation

## 2015-09-08 DIAGNOSIS — I10 Essential (primary) hypertension: Secondary | ICD-10-CM | POA: Diagnosis not present

## 2015-09-08 DIAGNOSIS — D252 Subserosal leiomyoma of uterus: Secondary | ICD-10-CM | POA: Diagnosis not present

## 2015-09-08 DIAGNOSIS — N920 Excessive and frequent menstruation with regular cycle: Secondary | ICD-10-CM | POA: Insufficient documentation

## 2015-09-08 DIAGNOSIS — Z90710 Acquired absence of both cervix and uterus: Secondary | ICD-10-CM | POA: Diagnosis present

## 2015-09-08 HISTORY — PX: LAPAROSCOPIC VAGINAL HYSTERECTOMY WITH SALPINGECTOMY: SHX6680

## 2015-09-08 HISTORY — DX: Anxiety disorder, unspecified: F41.9

## 2015-09-08 HISTORY — PX: HYSTERECTOMY ABDOMINAL WITH SALPINGECTOMY: SHX6725

## 2015-09-08 LAB — GLUCOSE, CAPILLARY
GLUCOSE-CAPILLARY: 229 mg/dL — AB (ref 65–99)
GLUCOSE-CAPILLARY: 393 mg/dL — AB (ref 65–99)
GLUCOSE-CAPILLARY: 358 mg/dL — AB (ref 65–99)
GLUCOSE-CAPILLARY: 118 mg/dL — AB (ref 65–99)
GLUCOSE-CAPILLARY: 227 mg/dL — AB (ref 65–99)
Glucose-Capillary: 322 mg/dL — ABNORMAL HIGH (ref 65–99)
Glucose-Capillary: 290 mg/dL — ABNORMAL HIGH (ref 65–99)
Glucose-Capillary: 242 mg/dL — ABNORMAL HIGH (ref 65–99)
Glucose-Capillary: 180 mg/dL — ABNORMAL HIGH (ref 65–99)
Glucose-Capillary: 139 mg/dL — ABNORMAL HIGH (ref 65–99)
Glucose-Capillary: 116 mg/dL — ABNORMAL HIGH (ref 65–99)

## 2015-09-08 LAB — PREGNANCY, URINE: Preg Test, Ur: NEGATIVE

## 2015-09-08 SURGERY — HYSTERECTOMY, VAGINAL, LAPAROSCOPY-ASSISTED, WITH SALPINGECTOMY
Anesthesia: General | Site: Abdomen | Laterality: Bilateral

## 2015-09-08 MED ORDER — FENTANYL CITRATE (PF) 100 MCG/2ML IJ SOLN
INTRAMUSCULAR | Status: DC | PRN
  Administered 2015-09-08: 100 ug via INTRAVENOUS
  Administered 2015-09-08: 150 ug via INTRAVENOUS

## 2015-09-08 MED ORDER — KETOROLAC TROMETHAMINE 30 MG/ML IJ SOLN
30.0000 mg | Freq: Four times a day (QID) | INTRAMUSCULAR | Status: DC
  Administered 2015-09-08 – 2015-09-09 (×3): 30 mg via INTRAVENOUS
  Filled 2015-09-08 (×3): qty 1

## 2015-09-08 MED ORDER — LIDOCAINE HCL (CARDIAC) 20 MG/ML IV SOLN
INTRAVENOUS | Status: AC
  Filled 2015-09-08: qty 5

## 2015-09-08 MED ORDER — PROPOFOL 10 MG/ML IV BOLUS
INTRAVENOUS | Status: DC | PRN
  Administered 2015-09-08: 180 mg via INTRAVENOUS

## 2015-09-08 MED ORDER — KETOROLAC TROMETHAMINE 30 MG/ML IJ SOLN
INTRAMUSCULAR | Status: DC | PRN
  Administered 2015-09-08: 30 mg via INTRAVENOUS

## 2015-09-08 MED ORDER — MIDAZOLAM HCL 2 MG/2ML IJ SOLN
INTRAMUSCULAR | Status: DC | PRN
  Administered 2015-09-08: 2 mg via INTRAVENOUS

## 2015-09-08 MED ORDER — HYDROCHLOROTHIAZIDE 25 MG PO TABS
25.0000 mg | ORAL_TABLET | Freq: Every day | ORAL | Status: DC
  Administered 2015-09-09 – 2015-09-10 (×2): 25 mg via ORAL
  Filled 2015-09-08 (×5): qty 1

## 2015-09-08 MED ORDER — ROCURONIUM BROMIDE 100 MG/10ML IV SOLN
INTRAVENOUS | Status: AC
  Filled 2015-09-08: qty 1

## 2015-09-08 MED ORDER — CEFAZOLIN SODIUM-DEXTROSE 2-4 GM/100ML-% IV SOLN
2.0000 g | INTRAVENOUS | Status: AC
  Administered 2015-09-08: 2 g via INTRAVENOUS

## 2015-09-08 MED ORDER — LIDOCAINE HCL (CARDIAC) 20 MG/ML IV SOLN
INTRAVENOUS | Status: DC | PRN
  Administered 2015-09-08: 80 mg via INTRAVENOUS

## 2015-09-08 MED ORDER — PROPOFOL 10 MG/ML IV BOLUS
INTRAVENOUS | Status: AC
Start: 2015-09-08 — End: 2015-09-08
  Filled 2015-09-08: qty 20

## 2015-09-08 MED ORDER — HYDROMORPHONE HCL 1 MG/ML IJ SOLN
INTRAMUSCULAR | Status: AC
  Filled 2015-09-08: qty 1

## 2015-09-08 MED ORDER — ROCURONIUM BROMIDE 100 MG/10ML IV SOLN
INTRAVENOUS | Status: DC | PRN
  Administered 2015-09-08 (×2): 10 mg via INTRAVENOUS
  Administered 2015-09-08: 40 mg via INTRAVENOUS

## 2015-09-08 MED ORDER — KETOROLAC TROMETHAMINE 30 MG/ML IJ SOLN
INTRAMUSCULAR | Status: AC
  Filled 2015-09-08: qty 1

## 2015-09-08 MED ORDER — LABETALOL HCL 5 MG/ML IV SOLN
20.0000 mg | Freq: Once | INTRAVENOUS | Status: AC
  Administered 2015-09-08: 20 mg via INTRAVENOUS
  Filled 2015-09-08: qty 4

## 2015-09-08 MED ORDER — ONDANSETRON HCL 4 MG PO TABS: 4.0000 mg | ORAL_TABLET | Freq: Four times a day (QID) | ORAL | Status: DC | PRN

## 2015-09-08 MED ORDER — OXYCODONE-ACETAMINOPHEN 5-325 MG PO TABS
1.0000 | ORAL_TABLET | ORAL | Status: DC | PRN
  Administered 2015-09-09: 2 via ORAL
  Administered 2015-09-09 (×2): 1 via ORAL
  Administered 2015-09-10 (×2): 2 via ORAL
  Administered 2015-09-10: 1 via ORAL
  Administered 2015-09-10: 2 via ORAL
  Filled 2015-09-08: qty 1
  Filled 2015-09-08 (×2): qty 2
  Filled 2015-09-08: qty 1
  Filled 2015-09-08: qty 2
  Filled 2015-09-08: qty 1
  Filled 2015-09-08: qty 2

## 2015-09-08 MED ORDER — MENTHOL 3 MG MT LOZG
1.0000 | LOZENGE | OROMUCOSAL | Status: DC | PRN
  Administered 2015-09-09: 3 mg via ORAL
  Filled 2015-09-08: qty 9

## 2015-09-08 MED ORDER — BUPIVACAINE HCL (PF) 0.25 % IJ SOLN
INTRAMUSCULAR | Status: AC
  Filled 2015-09-08: qty 30

## 2015-09-08 MED ORDER — SODIUM CHLORIDE 0.9 % IV SOLN
INTRAVENOUS | Status: DC
  Administered 2015-09-08: 3 [IU]/h via INTRAVENOUS
  Filled 2015-09-08: qty 2.5

## 2015-09-08 MED ORDER — BUPIVACAINE HCL (PF) 0.25 % IJ SOLN
INTRAMUSCULAR | Status: DC | PRN
  Administered 2015-09-08: 5 mL

## 2015-09-08 MED ORDER — ONDANSETRON HCL 4 MG/2ML IJ SOLN
INTRAMUSCULAR | Status: DC | PRN
  Administered 2015-09-08: 4 mg via INTRAVENOUS

## 2015-09-08 MED ORDER — INSULIN ASPART 100 UNIT/ML ~~LOC~~ SOLN
10.0000 [IU] | Freq: Once | SUBCUTANEOUS | Status: AC
  Administered 2015-09-08: 10 [IU] via SUBCUTANEOUS

## 2015-09-08 MED ORDER — FENTANYL CITRATE (PF) 100 MCG/2ML IJ SOLN
INTRAMUSCULAR | Status: AC
  Administered 2015-09-08: 50 ug via INTRAVENOUS
  Filled 2015-09-08: qty 2

## 2015-09-08 MED ORDER — FENTANYL CITRATE (PF) 100 MCG/2ML IJ SOLN
INTRAMUSCULAR | Status: AC
  Filled 2015-09-08: qty 2

## 2015-09-08 MED ORDER — INSULIN ASPART 100 UNIT/ML ~~LOC~~ SOLN
SUBCUTANEOUS | Status: AC
  Administered 2015-09-08: 10 [IU] via SUBCUTANEOUS
  Filled 2015-09-08: qty 1

## 2015-09-08 MED ORDER — LACTATED RINGERS IR SOLN
Status: DC | PRN
  Administered 2015-09-08: 3000 mL

## 2015-09-08 MED ORDER — LABETALOL HCL 5 MG/ML IV SOLN
5.0000 mg | Freq: Once | INTRAVENOUS | Status: AC
  Administered 2015-09-08: 5 mg via INTRAVENOUS

## 2015-09-08 MED ORDER — DEXAMETHASONE SODIUM PHOSPHATE 4 MG/ML IJ SOLN
INTRAMUSCULAR | Status: AC
  Filled 2015-09-08: qty 1

## 2015-09-08 MED ORDER — SCOPOLAMINE 1 MG/3DAYS TD PT72
1.0000 | MEDICATED_PATCH | Freq: Once | TRANSDERMAL | Status: DC
  Administered 2015-09-08: 1.5 mg via TRANSDERMAL

## 2015-09-08 MED ORDER — FENTANYL CITRATE (PF) 100 MCG/2ML IJ SOLN
25.0000 ug | INTRAMUSCULAR | Status: DC | PRN
  Administered 2015-09-08 (×3): 50 ug via INTRAVENOUS

## 2015-09-08 MED ORDER — ONDANSETRON HCL 4 MG/2ML IJ SOLN
INTRAMUSCULAR | Status: AC
  Filled 2015-09-08: qty 2

## 2015-09-08 MED ORDER — LACTATED RINGERS IV SOLN
INTRAVENOUS | Status: DC
  Administered 2015-09-08 (×2): via INTRAVENOUS

## 2015-09-08 MED ORDER — DOCUSATE SODIUM 100 MG PO CAPS
100.0000 mg | ORAL_CAPSULE | Freq: Two times a day (BID) | ORAL | Status: DC
  Administered 2015-09-08 – 2015-09-10 (×4): 100 mg via ORAL
  Filled 2015-09-08 (×4): qty 1

## 2015-09-08 MED ORDER — KETOROLAC TROMETHAMINE 30 MG/ML IJ SOLN: 30.0000 mg | Freq: Four times a day (QID) | INTRAMUSCULAR | Status: DC

## 2015-09-08 MED ORDER — INSULIN ASPART 100 UNIT/ML ~~LOC~~ SOLN
0.0000 [IU] | Freq: Three times a day (TID) | SUBCUTANEOUS | Status: DC
  Administered 2015-09-09: 5 [IU] via SUBCUTANEOUS
  Administered 2015-09-09: 11 [IU] via SUBCUTANEOUS
  Administered 2015-09-09: 2 [IU] via SUBCUTANEOUS
  Administered 2015-09-10: 5 [IU] via SUBCUTANEOUS
  Administered 2015-09-10 (×2): 3 [IU] via SUBCUTANEOUS

## 2015-09-08 MED ORDER — MEPERIDINE HCL 25 MG/ML IJ SOLN: 6.2500 mg | INTRAMUSCULAR | Status: DC | PRN

## 2015-09-08 MED ORDER — ONDANSETRON HCL 4 MG/2ML IJ SOLN: 4.0000 mg | Freq: Four times a day (QID) | INTRAMUSCULAR | Status: DC | PRN

## 2015-09-08 MED ORDER — SCOPOLAMINE 1 MG/3DAYS TD PT72
MEDICATED_PATCH | TRANSDERMAL | Status: AC
  Filled 2015-09-08: qty 1

## 2015-09-08 MED ORDER — MIDAZOLAM HCL 2 MG/2ML IJ SOLN
INTRAMUSCULAR | Status: AC
  Filled 2015-09-08: qty 2

## 2015-09-08 MED ORDER — GLYCOPYRROLATE 0.2 MG/ML IJ SOLN
INTRAMUSCULAR | Status: DC | PRN
  Administered 2015-09-08: .4 mg via INTRAVENOUS

## 2015-09-08 MED ORDER — HYDROMORPHONE HCL 1 MG/ML IJ SOLN
INTRAMUSCULAR | Status: DC | PRN
  Administered 2015-09-08: 2 mg via INTRAVENOUS

## 2015-09-08 MED ORDER — LACTATED RINGERS IV SOLN
INTRAVENOUS | Status: DC
  Administered 2015-09-08 (×2): via INTRAVENOUS

## 2015-09-08 MED ORDER — SIMETHICONE 80 MG PO CHEW
80.0000 mg | CHEWABLE_TABLET | Freq: Four times a day (QID) | ORAL | Status: DC | PRN
  Administered 2015-09-09: 80 mg via ORAL
  Filled 2015-09-08: qty 1

## 2015-09-08 MED ORDER — NEOSTIGMINE METHYLSULFATE 10 MG/10ML IV SOLN
INTRAVENOUS | Status: DC | PRN
  Administered 2015-09-08: 3 mg via INTRAVENOUS

## 2015-09-08 MED ORDER — EPHEDRINE SULFATE 50 MG/ML IJ SOLN
INTRAMUSCULAR | Status: DC | PRN
  Administered 2015-09-08: 5 mg via INTRAVENOUS

## 2015-09-08 MED ORDER — LABETALOL HCL 5 MG/ML IV SOLN
INTRAVENOUS | Status: AC
  Administered 2015-09-08: 5 mg via INTRAVENOUS
  Filled 2015-09-08: qty 4

## 2015-09-08 MED ORDER — FENTANYL CITRATE (PF) 250 MCG/5ML IJ SOLN
INTRAMUSCULAR | Status: AC
  Filled 2015-09-08: qty 5

## 2015-09-08 MED ORDER — LABETALOL HCL 5 MG/ML IV SOLN: 5.0000 mg | Freq: Once | INTRAVENOUS | Status: DC

## 2015-09-08 MED ORDER — PROMETHAZINE HCL 25 MG/ML IJ SOLN: 6.2500 mg | INTRAMUSCULAR | Status: DC | PRN

## 2015-09-08 MED ORDER — HYDROMORPHONE HCL 1 MG/ML IJ SOLN
0.2000 mg | INTRAMUSCULAR | Status: DC | PRN
  Administered 2015-09-08 – 2015-09-09 (×5): 0.6 mg via INTRAVENOUS
  Filled 2015-09-08 (×6): qty 1

## 2015-09-08 SURGICAL SUPPLY — 71 items
BENZOIN TINCTURE PRP APPL 2/3 (GAUZE/BANDAGES/DRESSINGS) ×3 IMPLANT
CABLE HIGH FREQUENCY MONO STRZ (ELECTRODE) IMPLANT
CANISTER SUCT 3000ML (MISCELLANEOUS) ×6 IMPLANT
CATH ROBINSON RED A/P 16FR (CATHETERS) ×3 IMPLANT
CELLS DAT CNTRL 66122 CELL SVR (MISCELLANEOUS) IMPLANT
CLOSURE WOUND 1/2 X4 (GAUZE/BANDAGES/DRESSINGS) ×1
CLOTH BEACON ORANGE TIMEOUT ST (SAFETY) ×6 IMPLANT
CONT PATH 16OZ SNAP LID 3702 (MISCELLANEOUS) ×3 IMPLANT
COVER BACK TABLE 60X90IN (DRAPES) ×3 IMPLANT
DECANTER SPIKE VIAL GLASS SM (MISCELLANEOUS) IMPLANT
DRAPE WARM FLUID 44X44 (DRAPE) IMPLANT
DRSG COVADERM PLUS 2X2 (GAUZE/BANDAGES/DRESSINGS) ×6 IMPLANT
DRSG OPSITE POSTOP 3X4 (GAUZE/BANDAGES/DRESSINGS) IMPLANT
DRSG OPSITE POSTOP 4X10 (GAUZE/BANDAGES/DRESSINGS) ×3 IMPLANT
DURAPREP 26ML APPLICATOR (WOUND CARE) ×6 IMPLANT
ELECT LIGASURE LONG (ELECTRODE) IMPLANT
ELECT REM PT RETURN 9FT ADLT (ELECTROSURGICAL)
ELECTRODE REM PT RTRN 9FT ADLT (ELECTROSURGICAL) IMPLANT
FILTER SMOKE EVAC LAPAROSHD (FILTER) ×3 IMPLANT
FORCEPS CUTTING 45CM 5MM (CUTTING FORCEPS) IMPLANT
GAUZE SPONGE 4X4 16PLY XRAY LF (GAUZE/BANDAGES/DRESSINGS) IMPLANT
GLOVE BIO SURGEON STRL SZ 6 (GLOVE) ×9 IMPLANT
GLOVE BIOGEL PI IND STRL 6 (GLOVE) ×4 IMPLANT
GLOVE BIOGEL PI IND STRL 7.0 (GLOVE) ×6 IMPLANT
GLOVE BIOGEL PI INDICATOR 6 (GLOVE) ×8
GLOVE BIOGEL PI INDICATOR 7.0 (GLOVE) ×12
GOWN STRL REUS W/TWL LRG LVL3 (GOWN DISPOSABLE) ×9 IMPLANT
HEMOSTAT SURGICEL 4X8 (HEMOSTASIS) IMPLANT
LEGGING LITHOTOMY PAIR STRL (DRAPES) ×3 IMPLANT
LIQUID BAND (GAUZE/BANDAGES/DRESSINGS) ×3 IMPLANT
MANIPULATOR UTERINE 4.5 ZUMI (MISCELLANEOUS) IMPLANT
NEEDLE HYPO 22GX1.5 SAFETY (NEEDLE) IMPLANT
NEEDLE INSUFFLATION 120MM (ENDOMECHANICALS) ×3 IMPLANT
NS IRRIG 1000ML POUR BTL (IV SOLUTION) ×6 IMPLANT
PACK ABDOMINAL GYN (CUSTOM PROCEDURE TRAY) IMPLANT
PACK LAVH (CUSTOM PROCEDURE TRAY) ×3 IMPLANT
PACK ROBOTIC GOWN (GOWN DISPOSABLE) ×3 IMPLANT
PAD OB MATERNITY 4.3X12.25 (PERSONAL CARE ITEMS) ×3 IMPLANT
PAD TRENDELENBURG POSITION (MISCELLANEOUS) ×3 IMPLANT
PENCIL SMOKE EVAC W/HOLSTER (ELECTROSURGICAL) ×3 IMPLANT
PROTECTOR NERVE ULNAR (MISCELLANEOUS) ×6 IMPLANT
RTRCTR WOUND ALEXIS 18CM MED (MISCELLANEOUS)
SEALER TISSUE G2 CVD JAW 45CM (ENDOMECHANICALS) IMPLANT
SET IRRIG TUBING LAPAROSCOPIC (IRRIGATION / IRRIGATOR) IMPLANT
SLEEVE XCEL OPT CAN 5 100 (ENDOMECHANICALS) ×3 IMPLANT
SPONGE LAP 18X18 X RAY DECT (DISPOSABLE) ×6 IMPLANT
STAPLER VISISTAT 35W (STAPLE) IMPLANT
STRIP CLOSURE SKIN 1/2X4 (GAUZE/BANDAGES/DRESSINGS) ×2 IMPLANT
SUT CHROMIC 2 0 TIES 18 (SUTURE) IMPLANT
SUT MNCRL 0 MO-4 VIOLET 18 CR (SUTURE) ×4 IMPLANT
SUT MNCRL 0 VIOLET 6X18 (SUTURE) ×1 IMPLANT
SUT MNCRL AB 3-0 PS2 27 (SUTURE) ×3 IMPLANT
SUT MON AB 2-0 CT1 36 (SUTURE) ×3 IMPLANT
SUT MON AB-0 CT1 36 (SUTURE) ×6 IMPLANT
SUT MONOCRYL 0 6X18 (SUTURE) ×2
SUT MONOCRYL 0 MO 4 18  CR/8 (SUTURE) ×8
SUT PDS AB 0 CT1 27 (SUTURE) ×3 IMPLANT
SUT PLAIN 2 0 XLH (SUTURE) IMPLANT
SUT VIC AB 0 CT1 27 (SUTURE) ×4
SUT VIC AB 0 CT1 27XBRD ANBCTR (SUTURE) ×2 IMPLANT
SUT VIC AB 3-0 X1 27 (SUTURE) IMPLANT
SUT VIC AB 4-0 KS 27 (SUTURE) ×6 IMPLANT
SUT VICRYL 0 TIES 12 18 (SUTURE) ×3 IMPLANT
SUT VICRYL 0 UR6 27IN ABS (SUTURE) ×3 IMPLANT
SYR CONTROL 10ML LL (SYRINGE) IMPLANT
TOWEL OR 17X24 6PK STRL BLUE (TOWEL DISPOSABLE) ×12 IMPLANT
TRAY FOLEY CATH SILVER 14FR (SET/KITS/TRAYS/PACK) ×6 IMPLANT
TROCAR XCEL NON-BLD 11X100MML (ENDOMECHANICALS) ×3 IMPLANT
TROCAR XCEL NON-BLD 5MMX100MML (ENDOMECHANICALS) ×3 IMPLANT
WARMER LAPAROSCOPE (MISCELLANEOUS) ×3 IMPLANT
WATER STERILE IRR 1000ML POUR (IV SOLUTION) ×3 IMPLANT

## 2015-09-08 NOTE — Progress Notes (Signed)
Inpatient Diabetes Program Recommendations  AACE/ADA: New Consensus Statement on Inpatient Glycemic Control (2015)  Target Ranges:  Prepandial:   less than 140 mg/dL      Peak postprandial:   less than 180 mg/dL (1-2 hours)      Critically ill patients:  140 - 180 mg/dL    Results for Tina Sloan, Tina Sloan (MRN [id number]) as of 09/08/2015 16:58  Ref. Range 09/08/2015 06:30 09/08/2015 09:42 09/08/2015 11:07 09/08/2015 11:40 09/08/2015 12:40 09/08/2015 13:45  Glucose-Capillary Latest Ref Range: 65-99 mg/dL 229 (H) 322 (H) 393 (H) 358 (H) 290 (H) 242 (H)   Review of Glycemic Control  Diabetes history: Prior history of dm Outpatient Diabetes medications: None listed Current orders for Inpatient glycemic control: moderate correction scale tidwc  Inpatient Diabetes Program Recommendations:    Noted patient's glucose high on admission, then put on IV insulin per GlucoStabilizer for 5 hours requiring a total of 18.3 units over a 5 hr period in addition to 10 units novolog subcutaneously prior to initiation of IV insulin drip. Have reviewed history which notes that patient had diabetes at one time but not noted to be in history at this admission. Admission glucose was 229 mg/dl and continued to rise.  The moderate correction scale is now ordered tidwc and cbg's to be tested 4 times daily including HS.  Please consider ordering a HgbA1C at this time to assess recent glucose control. Please also order diet to be advanced to carbohydrate modified as well. Will follow and assist as needed. Thank you Rosita Kea, RN, MSN, CDE  Diabetes Inpatient Program Office: [phone] Pager: [phone] 8:00 am to 5:00 pm

## 2015-09-08 NOTE — Op Note (Signed)
Tina Sloan PROCEDURE DATE: 09/08/2015  PREOPERATIVE DIAGNOSIS:  Heavy menstrual bleeding, dysmenorrhea POSTOPERATIVE DIAGNOSIS:  SAA SURGEON:   Dr. Linda Hedges ASSISTANT:   Dr. Dian Queen OPERATION:  Diagnostic laparoscopy, Total abdominal hysterectomy, bilateral salpingectomy ANESTHESIA:  General endotracheal.  INDICATIONS: The patient is a 45 y.o. G2P0020 with history of dysmenorrhea and heavy menstrual bleeding. The patient made a decision to undergo definite surgical treatment. On the preoperative visit, the risks, benefits, indications, and alternatives of the procedure were reviewed with the patient.  On the day of surgery, the risks of surgery were again discussed with the patient including but not limited to: bleeding which may require transfusion or reoperation; infection which may require antibiotics; injury to bowel, bladder, ureters or other surrounding organs; need for additional procedures; thromboembolic phenomenon, incisional problems and other postoperative/anesthesia complications. Written informed consent was obtained.    OPERATIVE FINDINGS: An enlarged fibroid uterus (496g) with normal tubes and ovaries bilaterally.  Endometriosis lesions (gun powder) involving the right broad ligament.    ESTIMATED BLOOD LOSS: 200 ml SPECIMENS:  Uterus and fallopian tubes sent to pathology COMPLICATIONS:  None immediate.   DESCRIPTION OF PROCEDURE: The patient received intravenous antibiotics and had sequential compression devices applied to her lower extremities while in the preoperative area.   She was taken to the operating room and placed under general anesthesia without difficulty and found to be adequate.The abdomen and perineum were prepped and draped in a sterile manner, and an in and out catheterization was performed.  After an adequate timeout was performed, a bivalved speculum was then placed in the patient's vagina, and the anterior lip of cervix grasped with the  single-tooth tenaculum.  The hulka clip was advanced into the uterus.  The speculum was removed from the vagina.  Attention was then turned to the patient's abdomen where a 10-mm skin incision was made on the umbilical fold.  The Veress needle was carefully introduced into the peritoneal cavity through the abdominal wall.  Intraperitoneal placement was confirmed by drop in intraabdominal pressure with insufflation of carbon dioxide gas.  Adequate pneumoperitoneum was obtained, and the 53mm trocar was then advanced without difficulty into the abdomen where intraabdominal placement was confirmed by the operative laparoscope.  Review of the pelvic anatomy revealed a pedunculated fundal fibroid which obscured visualization of the left sidewall.  Also, endometriosis lesion were noted on the right broad ligament.  A posterior left subserosal fibroid was noted in the mid-corpus region.  Based on the enlarged size of the uterus, location of the fibroids, endometriosis and nulliparity, the decision was made to convert to abdominal procedure.  The scope and trocar were removed. The uterine manipulator and the tenaculum were removed from the vagina without complications. Foley catheter was inserted into the bladder and attached to constant drainage.   Attention was turned to the abdomen where a Pfannensteil skin incision was made. This incision was taken down to the fascia using electrocautery with care given to maintain good hemostasis. The fascia was incised in the midline and the fascial incision was then extended bilaterally using electrocautery without difficulty. The fascia was then dissected off the underlying rectus muscles using blunt and sharp dissection. The rectus muscles were split bluntly in the midline and the peritoneum entered sharply without complication. This peritoneal incision was then extended superiorly and inferiorly with care given to prevent bowel or bladder injury. The uterus at this point was  noted to be mobilized and was elevated out of the skin incision. The round  ligaments on each side were clamped, suture ligated, and transected with electrocautery allowing entry into the broad ligament. The anterior and posterior leaves of the broad ligament were separated and the ureters were inspected to be safely away from the area of dissection bilaterally. A hole was created in the clear portion of the posterior broad ligament. Adnexae were clamped on the patient's right side. This pedicle was then clamped, cut, and doubly suture ligated with good hemostasis. This procedure was repeated in an identical fashion on the left site allowing for both adnexa to remain in place. A bladder flap was then created across the anterior leaf of the broad ligament and the bladder was then bluntly dissected off the lower uterine segment and cervix with good hemostasis. The uterine arteries were then skeletonized bilaterally and then clamped, cut, and ligated with care given to prevent ureteral injury. The uterosacral ligaments were then clamped, cut, and ligated bilaterally.  Finally, the cardinal ligaments were clamped, cut, and ligated bilaterally.  Acutely curved clamps were placed across the vagina just under the cervix, and the specimen was amputated and sent to pathology. The vaginal cuff was closed with a series of interrupted 0 Vicryl figure-of-eight sutures with care given to incorporate the anterior pubocervical fascia and the posterior rectovaginal fascia.  The vaginal cuff angles were noted to have good hemostasis.  The pelvis was irrigated and hemostasis was reconfirmed at all pedicles and along the pelvic sidewall.  All instruments were removed from the abdomen. The peritoneum was closed with 2-0 Monocryl, and the fascia was closed with 0 Vicryl in a running fashion. The skin was closed with a 4-0 Vicryl subcuticular stitch. The infraumbilical fascial incision was closed with 0 Vicryl in a figure of eight stitch.   The infraumbilical skin incision was closed with 4-0 monocryl in a subcuticular fashion.  Sponge, lap, needle, and instrument counts were correct times two. The patient was taken to the recovery area awake, extubated and in stable condition.

## 2015-09-08 NOTE — Progress Notes (Addendum)
NOS  Patient reports good pain control.  No CP/SOB.    VSS. AF.  Gen: A&O x 3 Abd: soft, ND, dressings c/d Ext: no c/c/e  POD#0 s/p TAH -Advance diet -HTN-labetalol now and then restart home meds (HCTZ) -DM-D/C Glucostabilizer and use SSI; start po meds tomorrow -Continue pain and nausea control -Ambulate -D/C IVF and Foley in AM -AM labs pending  Linda Hedges, DO

## 2015-09-08 NOTE — Anesthesia Procedure Notes (Signed)
Procedure Name: Intubation Date/Time: 09/08/2015 7:32 AM Performed by: Hewitt Blade Pre-anesthesia Checklist: Patient identified, Emergency Drugs available, Suction available and Patient being monitored Patient Re-evaluated:Patient Re-evaluated prior to inductionOxygen Delivery Method: Circle system utilized Preoxygenation: Pre-oxygenation with 100% oxygen Intubation Type: IV induction Ventilation: Mask ventilation without difficulty Laryngoscope Size: Mac and 3 Grade View: Grade I Tube type: Oral Tube size: 7.0 mm Number of attempts: 1 Airway Equipment and Method: Stylet Placement Confirmation: ETT inserted through vocal cords under direct vision,  positive ETCO2 and breath sounds checked- equal and bilateral Secured at: 21 cm Tube secured with: Tape Dental Injury: Teeth and Oropharynx as per pre-operative assessment

## 2015-09-08 NOTE — Anesthesia Postprocedure Evaluation (Signed)
Anesthesia Post Note  Patient: Tina Sloan  Procedure(s) Performed: Procedure(s) (LRB): ATTEMPTED LAPAROSCOPIC ASSISTED VAGINAL HYSTERECTOMY WITH SALPINGECTOMY (Bilateral) HYSTERECTOMY ABDOMINAL WITH SALPINGECTOMY (Bilateral)  Patient location during evaluation: PACU Anesthesia Type: General Level of consciousness: awake and alert and oriented Pain management: pain level controlled Vital Signs Assessment: post-procedure vital signs reviewed and stable Respiratory status: spontaneous breathing, nonlabored ventilation, respiratory function stable and patient connected to nasal cannula oxygen Cardiovascular status: blood pressure returned to baseline and stable Postop Assessment: no signs of nausea or vomiting Anesthetic complications: no Comments: Elevated blood glucose in PACU. Did not respond to SQ insulin, so started on glucose stabilizer. BP also elevated and was given Labetalol for BP with good control.     Last Vitals:  Filed Vitals:   09/08/15 1130 09/08/15 1145  BP: 183/91 171/92  Pulse: 70 75  Temp:    Resp: 24 19    Last Pain:  Filed Vitals:   09/08/15 1151  PainSc: 2    Pain Goal:                 Mohammad Granade A.

## 2015-09-08 NOTE — Progress Notes (Signed)
Dr. Lynnette Caffey at bedside discussing surgery and POC with pt.  She is aware of pt's most recent CBGs and BPs.  Verbal order given to stop Glucostabilizer.    Dr. Lynnette Caffey stated that she would enter orders for additional medications for blood pressure and diabetes.

## 2015-09-08 NOTE — OR Nursing (Signed)
12:05 DR. Morris notified of pt elevated blood pressures and elevated blood sugars and that pt was placed on glucostabilizer.  No further orders received.Tina Sloan

## 2015-09-08 NOTE — Transfer of Care (Signed)
Immediate Anesthesia Transfer of Care Note  Patient: Tina Sloan  Procedure(s) Performed: Procedure(s): ATTEMPTED LAPAROSCOPIC ASSISTED VAGINAL HYSTERECTOMY WITH SALPINGECTOMY (Bilateral) HYSTERECTOMY ABDOMINAL WITH SALPINGECTOMY (Bilateral)  Patient Location: PACU  Anesthesia Type:General  Level of Consciousness: awake, alert  and oriented  Airway & Oxygen Therapy: Patient Spontanous Breathing and Patient connected to nasal cannula oxygen  Post-op Assessment: Report given to RN, Post -op Vital signs reviewed and stable and Patient moving all extremities X 4  Post vital signs: Reviewed and stable  Last Vitals:  Filed Vitals:   09/08/15 0611 09/08/15 0643  BP: 163/83 161/71  Pulse: 78   Temp: 36.8 C   Resp: 18     Last Pain:  Filed Vitals:   09/08/15 0644  PainSc: 2          Complications: No apparent anesthesia complications

## 2015-09-08 NOTE — Anesthesia Postprocedure Evaluation (Signed)
Anesthesia Post Note  Patient: Tina Sloan  Procedure(s) Performed: Procedure(s) (LRB): ATTEMPTED LAPAROSCOPIC ASSISTED VAGINAL HYSTERECTOMY WITH SALPINGECTOMY (Bilateral) HYSTERECTOMY ABDOMINAL WITH SALPINGECTOMY (Bilateral)  Patient location during evaluation: [facility] Anesthesia Type: General Level of consciousness: awake Pain management: satisfactory to patient Vital Signs Assessment: post-procedure vital signs reviewed and stable Respiratory status: spontaneous breathing Cardiovascular status: stable Anesthetic complications: no     Last Vitals:  Filed Vitals:   09/08/15 1440 09/08/15 1809  BP: 160/78 173/76  Pulse: 60 67  Temp: 37 C   Resp: 16 16    Last Pain:  Filed Vitals:   09/08/15 1815  PainSc: Asleep   Pain Goal: Patients Stated Pain Goal: 3 (09/08/15 1315)               Casimer Lanius

## 2015-09-08 NOTE — Progress Notes (Signed)
No change to H&P.  Becki Mccaskill, DO 

## 2015-09-08 NOTE — Progress Notes (Signed)
Dr Delma Post informed of patient's CBG 229 in SS this morning and her BP on recheck in SS 161/71.  No orders given.  [place] for surgery.

## 2015-09-09 DIAGNOSIS — N939 Abnormal uterine and vaginal bleeding, unspecified: Secondary | ICD-10-CM | POA: Diagnosis not present

## 2015-09-09 LAB — COMPREHENSIVE METABOLIC PANEL
ALBUMIN: 3 g/dL — AB (ref 3.5–5.0)
ALK PHOS: 30 U/L — AB (ref 38–126)
ALT: 13 U/L — AB (ref 14–54)
AST: 14 U/L — AB (ref 15–41)
Anion gap: 5 (ref 5–15)
BILIRUBIN TOTAL: 0.4 mg/dL (ref 0.3–1.2)
BUN: 16 mg/dL (ref 6–20)
CALCIUM: 8.1 mg/dL — AB (ref 8.9–10.3)
CO2: 26 mmol/L (ref 22–32)
CREATININE: 0.86 mg/dL (ref 0.44–1.00)
Chloride: 106 mmol/L (ref 101–111)
GFR calc Af Amer: >60 mL/min (ref >60)
GFR calc non Af Amer: >60 mL/min (ref >60)
GLUCOSE: 206 mg/dL — AB (ref 65–99)
Potassium: 3.8 mmol/L (ref 3.5–5.1)
Sodium: 137 mmol/L (ref 135–145)
TOTAL PROTEIN: 5.6 g/dL — AB (ref 6.5–8.1)

## 2015-09-09 LAB — CBC
HEMATOCRIT: 28.6 % — AB (ref 36.0–46.0)
HEMOGLOBIN: 9.1 g/dL — AB (ref 12.0–15.0)
MCH: 29.2 pg (ref 26.0–34.0)
MCHC: 31.8 g/dL (ref 30.0–36.0)
MCV: 91.7 fL (ref 78.0–100.0)
Platelets: 234 10*3/uL (ref 150–400)
RBC: 3.12 MIL/uL — ABNORMAL LOW (ref 3.87–5.11)
RDW: 17.4 % — ABNORMAL HIGH (ref 11.5–15.5)
WBC: 5 10*3/uL (ref 4.0–10.5)

## 2015-09-09 LAB — GLUCOSE, CAPILLARY
GLUCOSE-CAPILLARY: 141 mg/dL — AB (ref 65–99)
GLUCOSE-CAPILLARY: 213 mg/dL — AB (ref 65–99)
Glucose-Capillary: 218 mg/dL — ABNORMAL HIGH (ref 65–99)
Glucose-Capillary: 302 mg/dL — ABNORMAL HIGH (ref 65–99)

## 2015-09-09 MED ORDER — GLYBURIDE 5 MG PO TABS
5.0000 mg | ORAL_TABLET | Freq: Two times a day (BID) | ORAL | Status: DC
  Administered 2015-09-09 – 2015-09-10 (×4): 5 mg via ORAL
  Filled 2015-09-09 (×6): qty 1

## 2015-09-09 MED ORDER — PSEUDOEPHEDRINE HCL 30 MG PO TABS
30.0000 mg | ORAL_TABLET | Freq: Once | ORAL | Status: AC
  Administered 2015-09-09: 30 mg via ORAL
  Filled 2015-09-09: qty 1

## 2015-09-09 MED ORDER — IBUPROFEN 600 MG PO TABS
600.0000 mg | ORAL_TABLET | Freq: Four times a day (QID) | ORAL | Status: DC | PRN
  Administered 2015-09-09 – 2015-09-10 (×3): 600 mg via ORAL
  Filled 2015-09-09 (×4): qty 1

## 2015-09-09 NOTE — Progress Notes (Signed)
1 Day Post-Op Procedure(s) (LRB): ATTEMPTED LAPAROSCOPIC ASSISTED VAGINAL HYSTERECTOMY WITH SALPINGECTOMY (Bilateral) HYSTERECTOMY ABDOMINAL WITH SALPINGECTOMY (Bilateral)  Subjective: Patient reports incisional pain, tolerating PO and no problems voiding.    Objective: I have reviewed patient's vital signs, intake and output, medications and labs.  General: alert, cooperative and appears stated age GI: incision: clean, dry and intact and appropriate ttp, non-distended Extremities: extremities normal, atraumatic, no cyanosis or edema  Assessment: s/p Procedure(s): ATTEMPTED LAPAROSCOPIC ASSISTED VAGINAL HYSTERECTOMY WITH SALPINGECTOMY (Bilateral) HYSTERECTOMY ABDOMINAL WITH SALPINGECTOMY (Bilateral): stable  Plan: Advance diet Encourage ambulation Advance to PO medication Discontinue IV fluids HTN-improved with HCTZ  DM-transition from SSI to glyburide      Tina Sloan 09/09/2015, 9:41 AM

## 2015-09-09 NOTE — Progress Notes (Signed)
I received a [place] consult from pt's RN, Kennith Center.  Emsley was doing much better today and was grateful to have the surgery behind her and grateful for the care she has received.  She did mention some of her grief over losing her "womb" as the consult mentioned, but she was not dwelling on it today.  She had friends in the room supporting her and she was relieved to be on this side of the healing process.  She described herself as a very spiritual person and prayer has been very helpful for her throughout this process.  She was appreciative of the support today and stated that she was really coping okay at this time.  Chaplain Janne Napoleon, [place] Pager, [phone] 2:32 PM    09/09/15 1400  Clinical Encounter Type  Visited With Patient  Visit Type Spiritual support  Referral From Nurse  Spiritual Encounters  Spiritual Needs Emotional  Stress Factors  Patient Stress Factors Health changes

## 2015-09-09 NOTE — Plan of Care (Signed)
Problem: Pain Managment: Goal: General experience of comfort will improve Outcome: Progressing Pt now taking po pain meds, appears to be resting well.

## 2015-09-10 DIAGNOSIS — N939 Abnormal uterine and vaginal bleeding, unspecified: Secondary | ICD-10-CM | POA: Diagnosis not present

## 2015-09-10 LAB — GLUCOSE, CAPILLARY
GLUCOSE-CAPILLARY: 169 mg/dL — AB (ref 65–99)
GLUCOSE-CAPILLARY: 190 mg/dL — AB (ref 65–99)
Glucose-Capillary: 241 mg/dL — ABNORMAL HIGH (ref 65–99)

## 2015-09-10 MED ORDER — HYDROCHLOROTHIAZIDE 25 MG PO TABS: 25.0000 mg | ORAL_TABLET | Freq: Every day | ORAL | Status: DC

## 2015-09-10 MED ORDER — GLYBURIDE 5 MG PO TABS: 5.0000 mg | ORAL_TABLET | Freq: Two times a day (BID) | ORAL | Status: AC

## 2015-09-10 MED ORDER — LEVOTHYROXINE SODIUM 25 MCG PO TABS: 25.0000 ug | ORAL_TABLET | Freq: Every day | ORAL | Status: AC

## 2015-09-10 MED ORDER — OXYCODONE-ACETAMINOPHEN 5-325 MG PO TABS: 1.0000 | ORAL_TABLET | ORAL | Status: AC | PRN

## 2015-09-10 NOTE — Progress Notes (Addendum)
Patient feeling well and desires d/c.  Will d/c home.  Rx send also for Synthroid 28mcg to treat recent diagnosis of hypothyroidism.  Linda Hedges, DO

## 2015-09-10 NOTE — Progress Notes (Signed)
2 Days Post-Op Procedure(s) (LRB): ATTEMPTED LAPAROSCOPIC ASSISTED VAGINAL HYSTERECTOMY WITH SALPINGECTOMY (Bilateral) HYSTERECTOMY ABDOMINAL WITH SALPINGECTOMY (Bilateral)  Subjective: Patient reports incisional pain, tolerating PO, + flatus and no problems voiding.    Objective: I have reviewed patient's vital signs, intake and output, medications and labs.  General: alert, cooperative and appears stated age GI: incision: clean, dry and intact and appropriate ttp Extremities: extremities normal, atraumatic, no cyanosis or edema  Assessment: s/p Procedure(s): ATTEMPTED LAPAROSCOPIC ASSISTED VAGINAL HYSTERECTOMY WITH SALPINGECTOMY (Bilateral) HYSTERECTOMY ABDOMINAL WITH SALPINGECTOMY (Bilateral): stable, progressing well and tolerating diet  Plan: Encourage ambulation Likely d/c this pm; will check on at lunch time.     Anntionette Madkins, [place] 09/10/2015, 9:09 AM

## 2015-09-10 NOTE — Discharge Summary (Signed)
Physician Discharge Summary  Patient ID: Tina Sloan MRN: [id number] DOB/AGE: [birth year] 44 y.o.  Admit date: 09/08/2015 Discharge date: 09/10/2015  Admission Diagnoses:  Heavy menstrual bleeding, dysmenorrhea  Discharge Diagnoses:   SAA with uterine leiomyomata, endometriosis Active Problems:   S/P hysterectomy   Discharged Condition: good  Hospital Course: Patient was admitted for definitive surgical management of above.  The attempt at [facility] was abandoned when larger than anticipated uterus and endometriosis was visualized.  TAH, bilateral salpingectomy was completed without complications.  The patient did well and on POD#2 was meeting all goals.  The patient was discharged home with follow up in the office in 1 week.  Consults: None  Significant Diagnostic Studies: labs: Hgb 9.1  Treatments: surgery: Diagnostic laparoscopy (failed attempt at LAVH), TAH, b/l salpingectomy  Discharge Exam: Blood pressure 131/77, pulse 73, temperature 98.5 F (36.9 C), temperature source Oral, resp. rate 16, height 5\' 3"  (1.6 m), weight 163 lb (73.936 kg), last menstrual period 09/05/2015, SpO2 99 %. General appearance: alert, cooperative and appears stated age GI: appropriate ttp Extremities: extremities normal, atraumatic, no cyanosis or edema Incision/Wound: dressings c/d x 2  Disposition: 01-Home or Self Care     Medication List    STOP taking these medications        sulfamethoxazole-trimethoprim 800-160 MG tablet  Commonly known as:  BACTRIM DS,SEPTRA DS      TAKE these medications        acetaminophen 650 MG CR tablet  Commonly known as:  TYLENOL  Take 1,300 mg by mouth every 8 (eight) hours as needed for pain. Reported on 08/26/2015     B-12 1000 MCG Subl  Place 1 tablet under the tongue daily.     Biotin 1000 MCG tablet  Take 2,000 mcg by mouth daily.     CO Q 10 PO  Take 1 tablet by mouth 2 (two) times a week.     glyBURIDE 5 MG tablet  Commonly known as:  DIABETA   Take 1 tablet (5 mg total) by mouth 2 (two) times daily with a meal.     hydrochlorothiazide 25 MG tablet  Commonly known as:  HYDRODIURIL  Take 1 tablet (25 mg total) by mouth daily.     hydrochlorothiazide 25 MG tablet  Commonly known as:  HYDRODIURIL  Take 1 tablet (25 mg total) by mouth daily.     ibuprofen 600 MG tablet  Commonly known as:  ADVIL,MOTRIN  Take 1 tablet (600 mg total) by mouth every 6 (six) hours as needed.     IRON PO  Take 1 tablet by mouth daily.     levothyroxine 25 MCG tablet  Commonly known as:  LEVOTHROID  Take 1 tablet (25 mcg total) by mouth daily before breakfast.     MAGNESIUM PO  Take 1 tablet by mouth daily as needed (constipation).     OMEGA 3-6-9 PO  Take 1 capsule by mouth 2 (two) times a week.     OVER THE COUNTER MEDICATION  Take 1 tablet by mouth 4 (four) times daily as needed (bloating). Diurex ultra     oxyCODONE-acetaminophen 5-325 MG tablet  Commonly known as:  PERCOCET/ROXICET  Take 1-2 tablets by mouth every 4 (four) hours as needed (moderate to severe pain (when tolerating fluids)).     VITAMIN D (CHOLECALCIFEROL) PO  Place 1 tablet under the tongue daily.         SignedLinda Hedges 09/10/2015, 3:19 PM

## 2015-09-10 NOTE — Progress Notes (Signed)
Inpatient Diabetes Program Recommendations  AACE/ADA: New Consensus Statement on Inpatient Glycemic Control (2015)  Target Ranges:  Prepandial:   less than 140 mg/dL      Peak postprandial:   less than 180 mg/dL (1-2 hours)      Critically ill patients:  140 - 180 mg/dL   Lab Results  Component Value Date   GLUCAP 169* 09/10/2015    Attempted to call patient in room without answer. May want to consider starting with metformin 500 mg bid at this point; then primary care provider can adjust therapy. Glad to assist with OP education if desired as well as treatment. However, it would be helpful to have a HgbA1C  Thank you Rosita Kea, RN, MSN, CDE  Diabetes Inpatient Program Office: [phone] Pager: [phone] 8:00 am to 5:00 pm

## 2015-09-10 NOTE — Discharge Instructions (Signed)
Call MD for T>100.4, heavy vaginal bleeding, severe abdominal pain, intractable nausea and/or vomiting, or respiratory distress.  Call office to schedule incision check 1 week postop.  Pelvic rest x 6 weeks.  No driving while taking narcotics.

## 2015-09-15 ENCOUNTER — Encounter (HOSPITAL_COMMUNITY): Payer: Self-pay | Admitting: Obstetrics & Gynecology

## 2017-01-24 ENCOUNTER — Encounter (HOSPITAL_COMMUNITY): Payer: Self-pay

## 2017-04-19 IMAGING — US US RENAL
1 series · 15 of 25 positions shown · non-contrast
Comparison: None.

CLINICAL DATA: Right flank pain for 5 days.

EXAM:
RENAL / URINARY TRACT ULTRASOUND COMPLETE

[Series 1: us renal · 49 acquisitions, 15 frames shown]
[im 1/49]
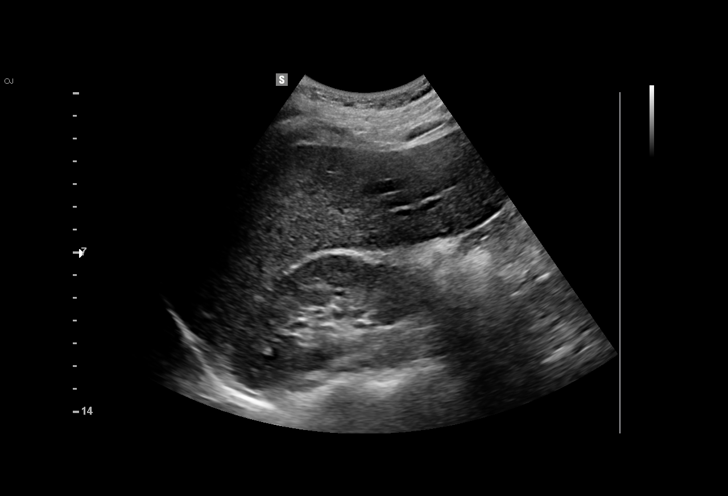
[im 5/49]
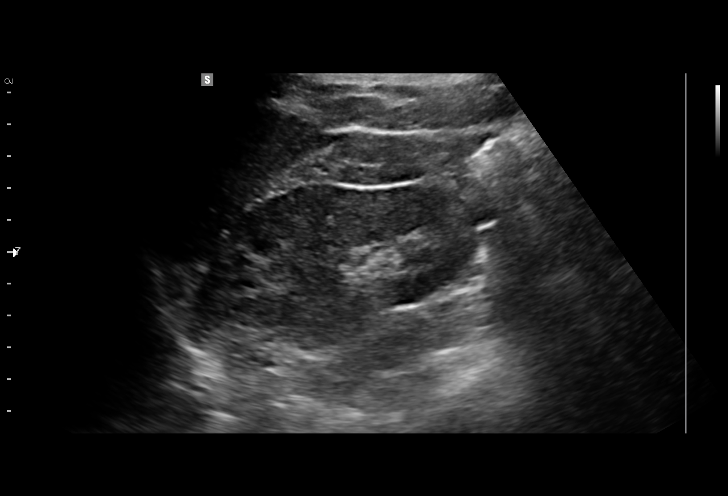
[im 9/49]
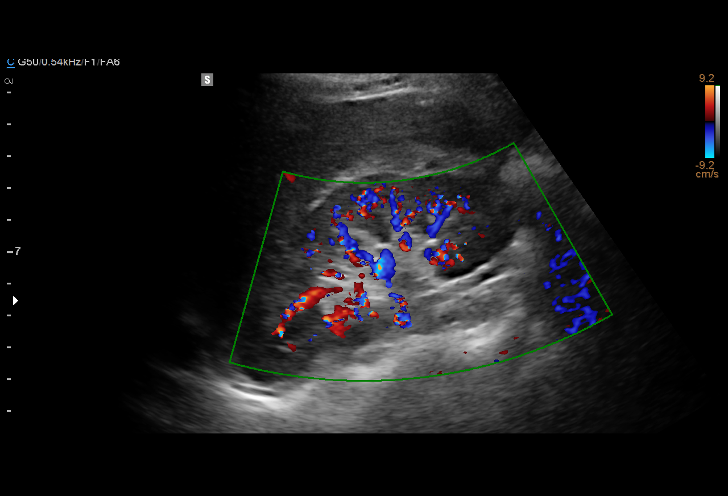
[im 11/49]
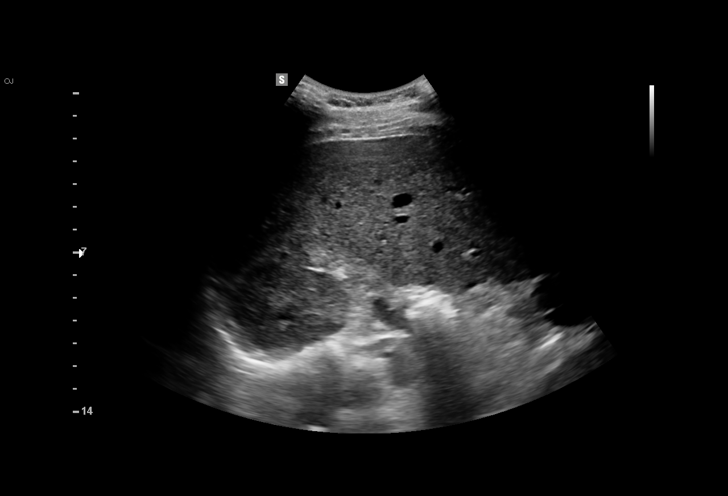
[im 15/49]
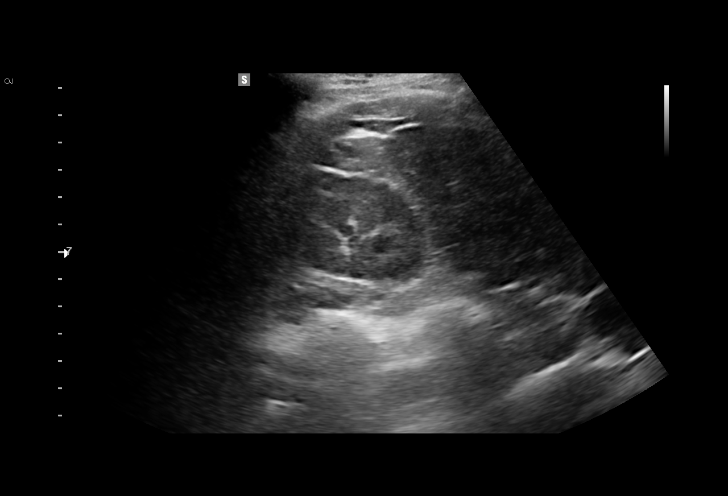
[im 19/49]
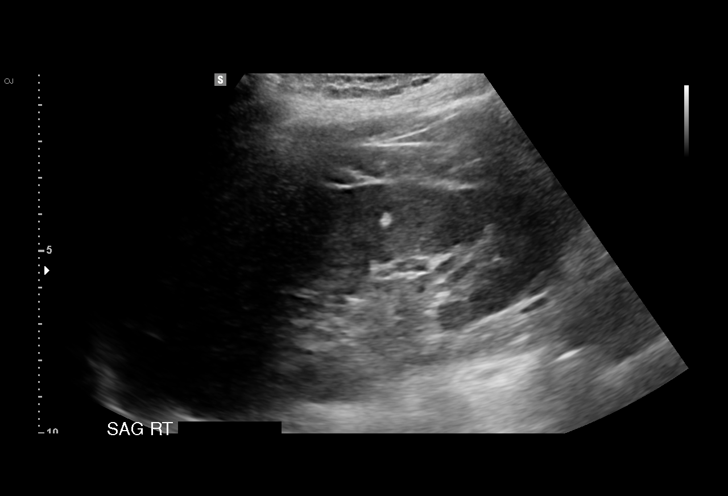
[im 21/49]
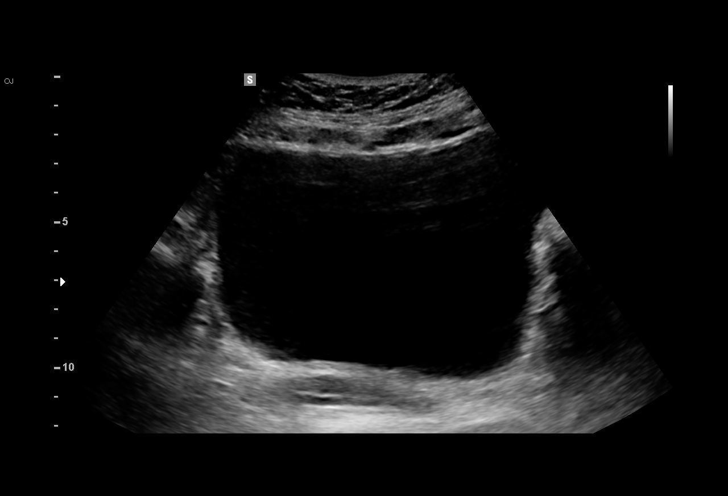
[im 25/49]
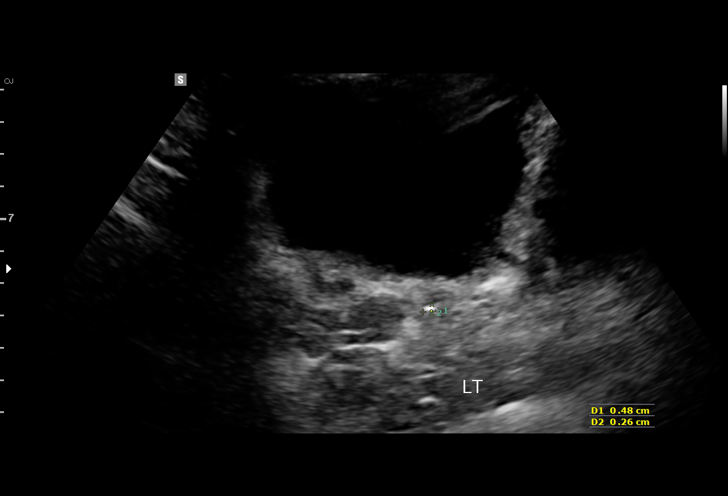
[im 29/49]
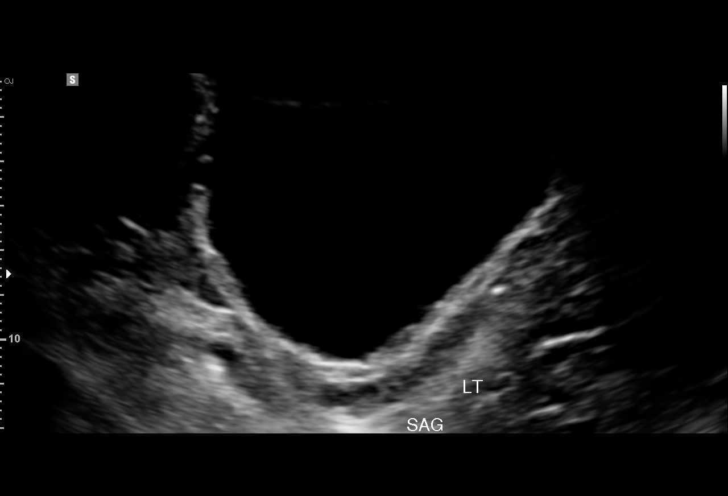
[im 31/49]
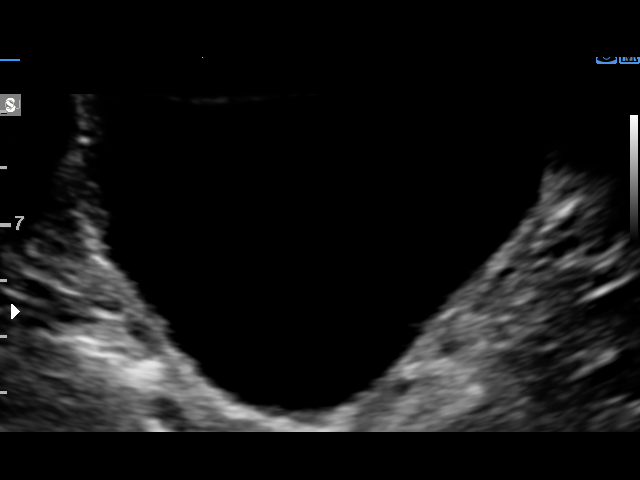
[im 35/49]
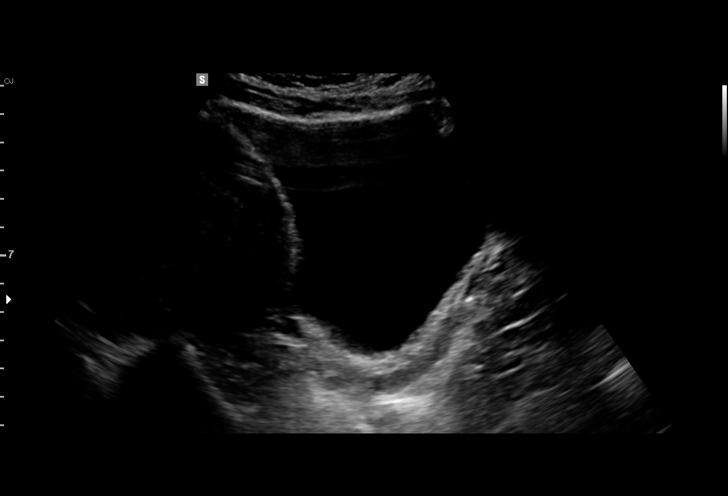
[im 39/49]
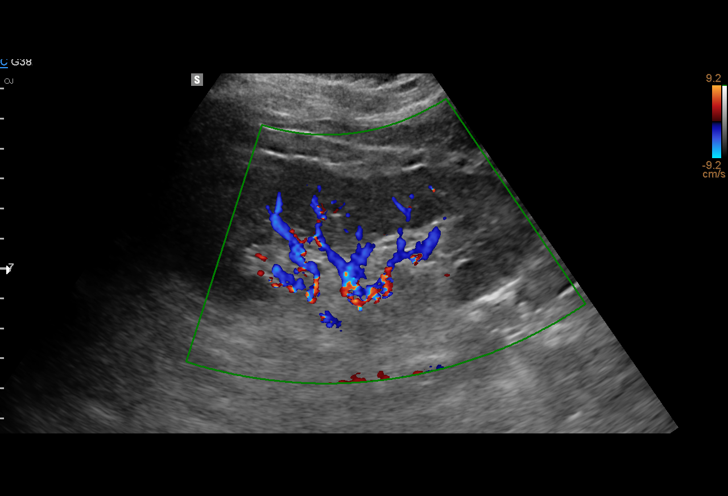
[im 41/49]
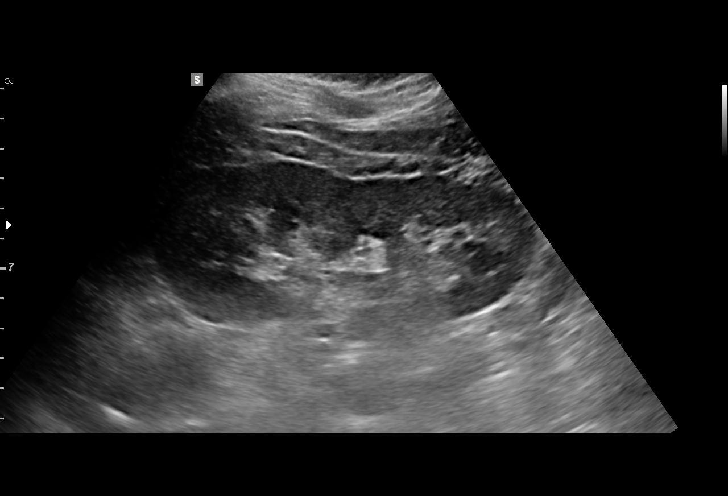
[im 45/49]
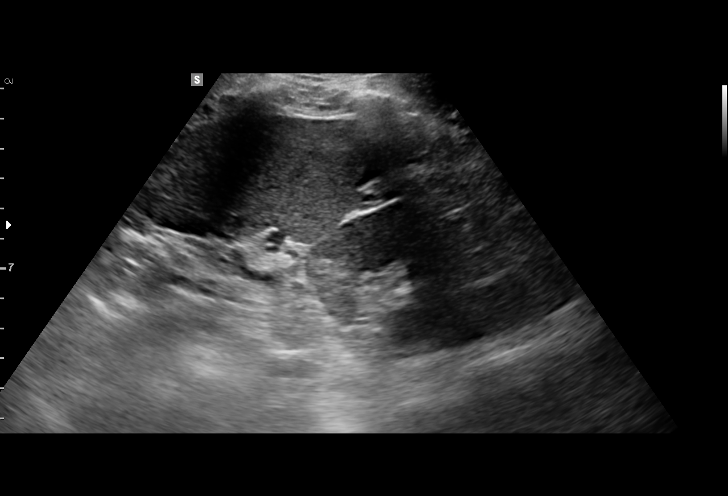
[im 49/49]
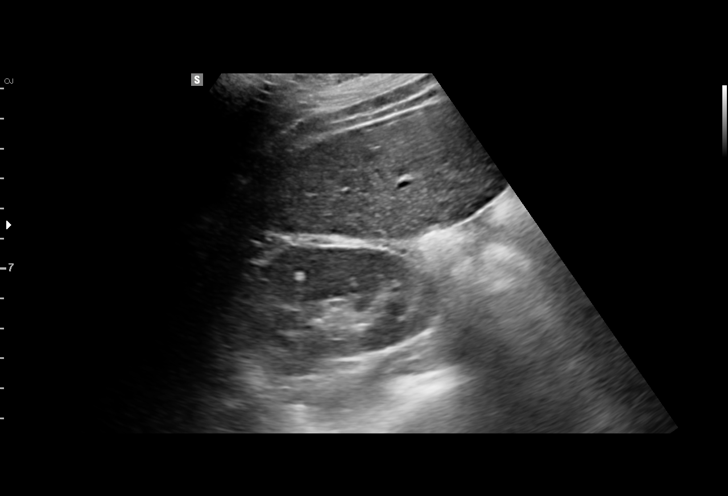

[15 of 25 positions shown; findings below may reference images not displayed]

FINDINGS: Right Kidney:

Length: 10.7 cm. Normal right renal parenchymal echogenicity and
thickness. No right hydronephrosis. Hyperechoic 0.5 x 0.4 x 0.6 cm
renal cortical lesion in the interpolar right kidney.

Left Kidney:

Length: 12.7 cm. Echogenicity within normal limits. No mass or
hydronephrosis visualized.

Bladder:

Appears normal for degree of bladder distention. Both ureteral jets
are demonstrated in the bladder lumen.
IMPRESSION: 1. No hydronephrosis. Both ureteral jets are seen in the bladder
lumen.
2. Nonspecific tiny hyperechoic 0.6 cm renal cortical lesion in the
interpolar right kidney. Most likely etiologies include a tiny renal
angiomyolipoma or a tiny renal cyst with milk of calcium. Recommend
a follow-up renal ultrasound in 6-12 months to document stability.
If this lesion enlarges on follow-up renal ultrasound, additional
evaluation with MRI could be performed at that time.
3. Normal bladder.

## 2017-11-16 ENCOUNTER — Emergency Department (HOSPITAL_COMMUNITY): Payer: BLUE CROSS/BLUE SHIELD

## 2017-11-16 ENCOUNTER — Emergency Department (HOSPITAL_COMMUNITY)
Admission: EM | Admit: 2017-11-16 | Discharge: 2017-11-16 | Disposition: A | Discharge status: Home / Self Care | Payer: BLUE CROSS/BLUE SHIELD | Attending: Emergency Medicine | Admitting: Emergency Medicine

## 2017-11-16 ENCOUNTER — Other Ambulatory Visit: Payer: Self-pay

## 2017-11-16 ENCOUNTER — Encounter (HOSPITAL_COMMUNITY): Payer: Self-pay | Admitting: Emergency Medicine

## 2017-11-16 DIAGNOSIS — M545 Low back pain, unspecified: Secondary | ICD-10-CM

## 2017-11-16 DIAGNOSIS — Z7984 Long term (current) use of oral hypoglycemic drugs: Secondary | ICD-10-CM | POA: Insufficient documentation

## 2017-11-16 DIAGNOSIS — E1165 Type 2 diabetes mellitus with hyperglycemia: Secondary | ICD-10-CM | POA: Insufficient documentation

## 2017-11-16 DIAGNOSIS — Z79899 Other long term (current) drug therapy: Secondary | ICD-10-CM | POA: Insufficient documentation

## 2017-11-16 DIAGNOSIS — R739 Hyperglycemia, unspecified: Secondary | ICD-10-CM

## 2017-11-16 DIAGNOSIS — Z87891 Personal history of nicotine dependence: Secondary | ICD-10-CM | POA: Diagnosis not present

## 2017-11-16 DIAGNOSIS — R52 Pain, unspecified: Secondary | ICD-10-CM

## 2017-11-16 DIAGNOSIS — I10 Essential (primary) hypertension: Secondary | ICD-10-CM | POA: Insufficient documentation

## 2017-11-16 LAB — I-STAT CHEM 8, ED
BUN: 14 mg/dL (ref 6–20)
Calcium, Ion: 1.08 mmol/L — ABNORMAL LOW (ref 1.15–1.40)
Chloride: 101 mmol/L (ref 98–111)
Creatinine, Ser: 0.6 mg/dL (ref 0.44–1.00)
Glucose, Bld: 214 mg/dL — ABNORMAL HIGH (ref 70–99)
HCT: 46 % (ref 36.0–46.0)
Hemoglobin: 15.6 g/dL — ABNORMAL HIGH (ref 12.0–15.0)
Potassium: 3.7 mmol/L (ref 3.5–5.1)
Sodium: 136 mmol/L (ref 135–145)
TCO2: 25 mmol/L (ref 22–32)

## 2017-11-16 MED ORDER — CYCLOBENZAPRINE HCL 10 MG PO TABS
5.0000 mg | ORAL_TABLET | Freq: Once | ORAL | Status: AC
  Administered 2017-11-16: 5 mg via ORAL
  Filled 2017-11-16: qty 1

## 2017-11-16 MED ORDER — CYCLOBENZAPRINE HCL 10 MG PO TABS: 10.0000 mg | ORAL_TABLET | Freq: Two times a day (BID) | ORAL | 0 refills | Status: AC | PRN

## 2017-11-16 MED ORDER — KETOROLAC TROMETHAMINE 30 MG/ML IJ SOLN
30.0000 mg | Freq: Once | INTRAMUSCULAR | Status: AC
  Administered 2017-11-16: 30 mg via INTRAMUSCULAR
  Filled 2017-11-16: qty 1

## 2017-11-16 MED ORDER — HYDROCHLOROTHIAZIDE 25 MG PO TABS: 25.0000 mg | ORAL_TABLET | Freq: Every day | ORAL | 0 refills | Status: AC

## 2017-11-16 MED ORDER — METFORMIN HCL 500 MG PO TABS: 500.0000 mg | ORAL_TABLET | Freq: Two times a day (BID) | ORAL | 0 refills | Status: DC

## 2017-11-16 MED ORDER — OXYCODONE-ACETAMINOPHEN 5-325 MG PO TABS
1.0000 | ORAL_TABLET | Freq: Once | ORAL | Status: AC
  Administered 2017-11-16: 1 via ORAL
  Filled 2017-11-16: qty 1

## 2017-11-16 NOTE — ED Notes (Signed)
Small insect resembling Bed Bug found in pt room. Contact precautions initiated

## 2017-11-16 NOTE — ED Triage Notes (Signed)
Pt reports lower back pain starting Friday after picking up carpet. Pt reports taking ibuprofen with no relief. Pt reports difficulty walking due to pain. Pt sent by PCP for XR.

## 2017-11-16 NOTE — ED Provider Notes (Signed)
[facility] EMERGENCY DEPARTMENT Provider Note   CSN: [id number] Arrival date & time: 11/16/17  1500     History   Chief Complaint Chief Complaint  Patient presents with  . Back Pain    HPI Tina Sloan is a 47 y.o. female.  HPI   47 year old female presents today with complaints of low back pain.  Patient notes symptoms started on Friday after lifting a rug.  She notes pain in the right lower lumbar region, denies any radiation to lower extremity, denies any loss of distal sensation strength motor function.  No changes in urine or bowel habits or characteristics.  No fever.  She notes history of back pain in the past that usually resolves in 2 to 3 days.  Patient notes taking ibuprofen at home without significant improvement in her symptoms.  Patient notes difficulty walking secondary to low back pain.  Patient also notes a history of hypertension and diabetes.  She notes she was recently seen at her primary care provider's office and had an A1c of greater than 11.  She notes she was not started on metformin at that time.  She notes she has not been taking her blood pressure medication either.  She denies any significant headache, chest pain, or any other concerning signs or symptoms.  Patient notes she has a poor diet with high sugar intake.     Past Medical History:  Diagnosis Date  . Anemia   . Anxiety    no meds  . Diabetes ([place])    On study but not currently on DM meds, appt on 7/17 for new meds  . Endometriosis   . Fibroids   . Heart murmur    slight - no problems  . Hyperlipidemia   . Swelling    generalized water retention    Patient Active Problem List   Diagnosis Date Noted  . S/P hysterectomy 09/08/2015    Past Surgical History:  Procedure Laterality Date  . HYSTERECTOMY ABDOMINAL WITH SALPINGECTOMY Bilateral 09/08/2015   Procedure: HYSTERECTOMY ABDOMINAL WITH SALPINGECTOMY;  Surgeon: Linda Jerrianne Hartin, DO;  Location: [facility];  Service:  Gynecology;  Laterality: Bilateral;  . LAPAROSCOPIC VAGINAL HYSTERECTOMY WITH SALPINGECTOMY Bilateral 09/08/2015   Procedure: ATTEMPTED LAPAROSCOPIC ASSISTED VAGINAL HYSTERECTOMY WITH SALPINGECTOMY;  Surgeon: Linda Jera Headings, DO;  Location: [facility];  Service: Gynecology;  Laterality: Bilateral;  . OVARIAN CYST REMOVAL    . WISDOM TOOTH EXTRACTION       OB History    Gravida  2   Para      Term      Preterm      AB  2   Living        SAB      TAB  2   Ectopic      Multiple      Live Births               Home Medications    Prior to Admission medications   Medication Sig Start Date End Date Taking? Authorizing Provider  acetaminophen (TYLENOL) 650 MG CR tablet Take 1,300 mg by mouth every 8 (eight) hours as needed for pain. Reported on 08/26/2015    [provider]  Biotin 1000 MCG tablet Take 2,000 mcg by mouth daily.     [provider]  Coenzyme Q10 (CO Q 10 PO) Take 1 tablet by mouth 2 (two) times a week.    [provider]  Cyanocobalamin (B-12) 1000 MCG SUBL Place 1 tablet  under the tongue daily.    [provider]  cyclobenzaprine (FLEXERIL) 10 MG tablet Take 1 tablet (10 mg total) by mouth 2 (two) times daily as needed for muscle spasms. 11/16/17   Aldena Worm, Dellis Filbert, PA-C  glyBURIDE (DIABETA) 5 MG tablet Take 1 tablet (5 mg total) by mouth 2 (two) times daily with a meal. 09/10/15   Morris, Megan, DO  hydrochlorothiazide (HYDRODIURIL) 25 MG tablet Take 1 tablet (25 mg total) by mouth daily. 11/16/17   Shariya Gaster, Dellis Filbert, PA-C  ibuprofen (ADVIL,MOTRIN) 600 MG tablet Take 1 tablet (600 mg total) by mouth every 6 (six) hours as needed. Patient not taking: Reported on 08/26/2015 07/03/15   Seabron Spates, CNM  IRON PO Take 1 tablet by mouth daily.    [provider]  levothyroxine (LEVOTHROID) 25 MCG tablet Take 1 tablet (25 mcg total) by mouth daily before breakfast. 09/10/15   Morris, Megan, DO  MAGNESIUM PO Take 1 tablet by mouth  daily as needed (constipation).     [provider]  metFORMIN (GLUCOPHAGE) 500 MG tablet Take 1 tablet (500 mg total) by mouth 2 (two) times daily with a meal. 11/16/17   Analeigha Nauman, Dellis Filbert, PA-C  Omega 3-6-9 Fatty Acids (OMEGA 3-6-9 PO) Take 1 capsule by mouth 2 (two) times a week.    [provider]  OVER THE COUNTER MEDICATION Take 1 tablet by mouth 4 (four) times daily as needed (bloating). Diurex ultra    [provider]  oxyCODONE-acetaminophen (PERCOCET/ROXICET) 5-325 MG tablet Take 1-2 tablets by mouth every 4 (four) hours as needed (moderate to severe pain (when tolerating fluids)). 09/10/15   Morris, Jinny Blossom, DO  VITAMIN D, CHOLECALCIFEROL, PO Place 1 tablet under the tongue daily.     [provider]    Family History Family History  Problem Relation Age of Onset  . Diabetes Father   . Heart disease Father        CABG, died age 61  . Hypertension Father   . Diabetes Mother   . Hypertension Mother     Social History Social History   Tobacco Use  . Smoking status: Former Smoker    Packs/day: 0.25    Types: Cigarettes    Last attempt to quit: 03/08/2010    Years since quitting: 7.7  . Smokeless tobacco: Never Used  . Tobacco comment: Quit tobacco 2012.    Substance Use Topics  . Alcohol use: No    Alcohol/week: 0.0 standard drinks  . Drug use: No     Allergies   Patient has no known allergies.   Review of Systems Review of Systems  All other systems reviewed and are negative.    Physical Exam Updated Vital Signs BP (!) 177/96 (BP Location: Right Arm)   Pulse 77   Temp 98.4 F (36.9 C) (Oral)   Resp 20   Ht 5' 3.5" (1.613 m)   LMP 09/05/2015 (Approximate)   SpO2 99%   BMI 28.42 kg/m   Physical Exam  Constitutional: She is oriented to person, place, and time. She appears well-developed and well-nourished.  HENT:  Head: Normocephalic and atraumatic.  Eyes: Pupils are equal, round, and reactive to light. Conjunctivae are  normal. Right eye exhibits no discharge. Left eye exhibits no discharge. No scleral icterus.  Neck: Normal range of motion. No JVD present. No tracheal deviation present.  Cardiovascular: Normal rate, regular rhythm, normal heart sounds and intact distal pulses. Exam reveals no gallop and no friction rub.  No murmur  heard. Pulmonary/Chest: Effort normal. No stridor.  Musculoskeletal:  TTP of right lateral lower lumbar musculature - no midline TTP- bilateral lower extremity strength sensation and motor function intact   Neurological: She is alert and oriented to person, place, and time. Coordination normal.  Psychiatric: She has a normal mood and affect. Her behavior is normal. Judgment and thought content normal.  Nursing note and vitals reviewed.   ED Treatments / Results  Labs (all labs ordered are listed, but only abnormal results are displayed) Labs Reviewed  I-STAT CHEM 8, ED - Abnormal; Notable for the following components:      Result Value   Glucose, Bld 214 (*)    Calcium, Ion 1.08 (*)    Hemoglobin 15.6 (*)    All other components within normal limits  I-STAT CHEM 8, ED    EKG None  Radiology Dg Lumbar Spine 2-3 Views  Result Date: 11/16/2017 CLINICAL DATA:  Low back pain with injury. EXAM: LUMBAR SPINE - 2-3 VIEW COMPARISON:  None. FINDINGS: This report assumes 5 non rib-bearing lumbar vertebrae. Slight dextrocurvature of the lumbar spine. Lumbar vertebral body heights are preserved, with no fracture. Minimal multilevel lumbar degenerative disc disease, most prominent at L2-3. No spondylolisthesis. No appreciable facet arthropathy. No aggressive appearing focal osseous lesions. IMPRESSION: No lumbar spine fracture or spondylolisthesis. Minimal multilevel lumbar degenerative disc disease. Electronically Signed   By: Ilona Sorrel M.D.   On: 11/16/2017 18:34    Procedures Procedures (including critical care time)  Medications Ordered in ED Medications  ketorolac  (TORADOL) 30 MG/ML injection 30 mg (30 mg Intramuscular Given 11/16/17 1802)  cyclobenzaprine (FLEXERIL) tablet 5 mg (5 mg Oral Given 11/16/17 1802)  oxyCODONE-acetaminophen (PERCOCET/ROXICET) 5-325 MG per tablet 1 tablet (1 tablet Oral Given 11/16/17 2000)     Initial Impression / Assessment and Plan / ED Course  I have reviewed the triage vital signs and the nursing notes.  Pertinent labs & imaging results that were available during my care of the patient were reviewed by me and considered in my medical decision making (see chart for details).    Labs:   Imaging: The lumbar 2 view  Consults:  Therapeutics:  Discharge Meds: Flexeril, HCTZ, metformin  Assessment/Plan: 47 year old female presents today with likely muscular back pain.  She has no neurological deficits, no signs of trauma, no red flags.  Patient did have improvement in symptoms here.  Patient also noted to be hypertensive, this is asymptomatic.  Also slightly hyperglycemic.  Given her recent A1c of greater than 11 she will be started on metformin, encouraged to use blood pressure medication, return immediately with any new or worsening signs or symptoms.  She will follow-up closely with the primary care provider for ongoing evaluation and management.  She will be discharged with symptom medic care, muscle relaxers, and strict return precautions.  She verbalized understanding and agreement to today's plan had no further questions or concerns at time discharge.   Final Clinical Impressions(s) / ED Diagnoses   Final diagnoses:  Hypertension, unspecified type  Hyperglycemia  Acute right-sided low back pain without sciatica    ED Discharge Orders         Ordered    metFORMIN (GLUCOPHAGE) 500 MG tablet  2 times daily with meals     11/16/17 2105    hydrochlorothiazide (HYDRODIURIL) 25 MG tablet  Daily     11/16/17 2105    cyclobenzaprine (FLEXERIL) 10 MG tablet  2 times daily PRN     11/16/17  2105           Okey Regal, PA-C 11/17/17 1957    Margette Fast, MD 11/18/17 1225

## 2017-11-16 NOTE — ED Notes (Signed)
Patient verbalizes understanding of discharge instructions. Opportunity for questioning and answers were provided. Armband removed by staff, pt discharged from ED ambulatory.   

## 2017-11-16 NOTE — Discharge Instructions (Addendum)
Please read attached information. If you experience any new or worsening signs or symptoms please return to the emergency room for evaluation. Please follow-up with your primary care provider or specialist as discussed. Please use medication prescribed only as directed and discontinue taking if you have any concerning signs or symptoms.   °

## 2019-10-23 ENCOUNTER — Other Ambulatory Visit: Payer: Self-pay | Admitting: Obstetrics and Gynecology

## 2019-10-23 DIAGNOSIS — R928 Other abnormal and inconclusive findings on diagnostic imaging of breast: Secondary | ICD-10-CM

## 2019-12-11 ENCOUNTER — Other Ambulatory Visit: Payer: Self-pay

## 2019-12-11 ENCOUNTER — Telehealth: Payer: Self-pay | Admitting: Physician Assistant

## 2019-12-11 NOTE — Telephone Encounter (Signed)
Received a new hem referral from Dr. Wilhelmenia Blase for thrombocytopenia. Tina Sloan cld to schedule an appt w/Cassie on 10/25 at 130pm w/labs at 1pm. Pt aware to arrive 20 minutes early.

## 2019-12-28 ENCOUNTER — Other Ambulatory Visit: Payer: Self-pay

## 2019-12-28 DIAGNOSIS — D696 Thrombocytopenia, unspecified: Secondary | ICD-10-CM

## 2019-12-30 NOTE — Progress Notes (Signed)
[facility] Telephone:([phone]) [phone]   Fax:([phone]) [phone]  CONSULT NOTE  REFERRING PHYSICIAN: Dr. Cathlean Marseilles  REASON FOR CONSULTATION:  Thombocytopenia  HPI Tina Sloan is a 49 y.o. female with a past medical history of diabetes mellitus, hypertension, and hypercholesteremia is referred to the clinic today for thrombocytopenia.   The patient's evaluation and to her thrombocytopenia began approximately 3 or 4 months ago.  The patient had a CBC performed which reportedly had a platelet count of 94,000.  This was then repeated by her PCP and her platelets were reportedly 100,000.  The patient recently had a follow up visit with her OBGYN on 12/06/19. She had routine blood work that day which showed continued isolated thrombocytopenia with a platelet count of 111k. During this time, the patient reported fatigue and bruising on her lower extremities. To the patient's knowledge, this is the first time she has had thrombocytopenia. Per chart review from blood work performed in 2017 and 2013, there was no evidence of thrombocytopenia. She was referred to the clinic today for further evaluation and investigation regarding her current condition and treatment options.  Overall, the patient is feeling fairly well.  She denies any recent illnesses or infections. Denies new medications. Her bruising has improved and she no longer has bruising on her lower extremities She denies any known significant bleeding. She denies any epistaxis, gingival bleeding, hematuria, hematochezia, or melena.  She had a hystercetomy in 2017 and denies bleeding complications from this surgery.  She had an emergency hysterectomy due to heavy menstrual bleeding from fibroids and endometriosis. She states that she does not takes any aspirin or Aleve. She does not take herbal supplements. She denies any history of liver disease, viral illnesses, or rheumatologic disorders. She denies any certain dietary habits as being a  vegan or vegetarian.   Patient denies any family history of any hematologic abnormalities.  The patient's mother passed away secondary to suicide.  She also reportedly had diabetes mellitus.  The patient's father also had diabetes mellitus.  The patient had 2 brothers both of which are deceased.  One brother passed away due to a drug overdose and the other brother passed away due to a motorcycle accident.  The patient works as a Engineer, site.  She is divorced and does not have any children.  She denies any drug or alcohol use.  The patient is a former smoker having smoked approximately 27 years half a pack to 1 pack/day. HPI  Past Medical History:  Diagnosis Date  . Anemia   . Anxiety    no meds  . Diabetes ([place])    On study but not currently on DM meds, appt on 7/17 for new meds  . Endometriosis   . Fibroids   . Heart murmur    slight - no problems  . Hyperlipidemia   . Swelling    generalized water retention    Past Surgical History:  Procedure Laterality Date  . HYSTERECTOMY ABDOMINAL WITH SALPINGECTOMY Bilateral 09/08/2015   Procedure: HYSTERECTOMY ABDOMINAL WITH SALPINGECTOMY;  Surgeon: Linda Hedges, DO;  Location: [facility];  Service: Gynecology;  Laterality: Bilateral;  . LAPAROSCOPIC VAGINAL HYSTERECTOMY WITH SALPINGECTOMY Bilateral 09/08/2015   Procedure: ATTEMPTED LAPAROSCOPIC ASSISTED VAGINAL HYSTERECTOMY WITH SALPINGECTOMY;  Surgeon: Linda Hedges, DO;  Location: [facility];  Service: Gynecology;  Laterality: Bilateral;  . OVARIAN CYST REMOVAL    . WISDOM TOOTH EXTRACTION      Family History  Problem Relation Age of Onset  . Diabetes  Father   . Heart disease Father        CABG, died age 36  . Hypertension Father   . Diabetes Mother   . Hypertension Mother     Social History Social History   Tobacco Use  . Smoking status: Former Smoker    Packs/day: 0.25    Types: Cigarettes    Quit date: 03/08/2010    Years since quitting: 9.8  . Smokeless tobacco: Never Used    . Tobacco comment: Quit tobacco 2012.    Substance Use Topics  . Alcohol use: No    Alcohol/week: 0.0 standard drinks  . Drug use: No    No Known Allergies  Current Outpatient Medications  Medication Sig Dispense Refill  . atorvastatin (LIPITOR) 20 MG tablet Take by mouth.    . fluticasone (FLONASE) 50 MCG/ACT nasal spray Place into the nose.    . liraglutide (VICTOZA) 18 MG/3ML SOPN Inject into the skin.    . metFORMIN (GLUCOPHAGE) 1000 MG tablet Take by mouth.    Marland Kitchen acetaminophen (TYLENOL) 650 MG CR tablet Take 1,300 mg by mouth every 8 (eight) hours as needed for pain. Reported on 08/26/2015    . Biotin 1000 MCG tablet Take 2,000 mcg by mouth daily.     . Coenzyme Q10 (CO Q 10 PO) Take 1 tablet by mouth 2 (two) times a week.    . Cyanocobalamin (B-12) 1000 MCG SUBL Place 1 tablet under the tongue daily.    . cyclobenzaprine (FLEXERIL) 10 MG tablet Take 1 tablet (10 mg total) by mouth 2 (two) times daily as needed for muscle spasms. 20 tablet 0  . estradiol (CLIMARA - DOSED IN MG/24 HR) 0.0375 mg/24hr patch Place 0.0375 mg onto the skin once a week.    . glyBURIDE (DIABETA) 5 MG tablet Take 1 tablet (5 mg total) by mouth 2 (two) times daily with a meal. 60 tablet 1  . hydrochlorothiazide (HYDRODIURIL) 25 MG tablet Take 1 tablet (25 mg total) by mouth daily. 30 tablet 0  . IRON PO Take 1 tablet by mouth daily.    Marland Kitchen levothyroxine (LEVOTHROID) 25 MCG tablet Take 1 tablet (25 mcg total) by mouth daily before breakfast. 30 tablet 1  . MAGNESIUM PO Take 1 tablet by mouth daily as needed (constipation).     Ernestine Conrad 3-6-9 Fatty Acids (OMEGA 3-6-9 PO) Take 1 capsule by mouth 2 (two) times a week.    Marland Kitchen OVER THE COUNTER MEDICATION Take 1 tablet by mouth 4 (four) times daily as needed (bloating). Diurex ultra    . oxyCODONE-acetaminophen (PERCOCET/ROXICET) 5-325 MG tablet Take 1-2 tablets by mouth every 4 (four) hours as needed (moderate to severe pain (when tolerating fluids)). 40 tablet 0  .  VITAMIN D, CHOLECALCIFEROL, PO Place 1 tablet under the tongue daily.      No current facility-administered medications for this visit.    REVIEW OF SYSTEMS:   Review of Systems  Constitutional: Positive for fatigue. Negative for appetite change, chills, fever and unexpected weight change.  HENT:   Negative for mouth sores, nosebleeds, sore throat and trouble swallowing.   Eyes: Negative for eye problems and icterus.  Respiratory: Negative for cough, hemoptysis, shortness of breath and wheezing.   Cardiovascular: Negative for chest pain and leg swelling.  Gastrointestinal: Negative for abdominal pain, constipation, diarrhea, nausea and vomiting.  Genitourinary: Negative for bladder incontinence, difficulty urinating, dysuria, frequency and hematuria.   Musculoskeletal: Negative for back pain, gait problem, neck pain and neck  stiffness.  Skin: Negative for itching and rash.  Neurological: Negative for dizziness, extremity weakness, gait problem, headaches, light-headedness and seizures.  Hematological: Negative for adenopathy. Reported bruising on lower extremities over the last 3-4 months.  Psychiatric/Behavioral: Negative for confusion, depression and sleep disturbance. The patient is not nervous/anxious.     PHYSICAL EXAMINATION:  Blood pressure (!) 175/100, pulse 77, temperature 97.8 F (36.6 C), temperature source Tympanic, resp. rate 18, height 5' 3.5" (1.613 m), weight 143 lb 8 oz (65.1 kg), last menstrual period 09/05/2015, SpO2 100 %.  ECOG PERFORMANCE STATUS: 0  Physical Exam  Constitutional: Oriented to person, place, and time and well-developed, well-nourished, and in no distress.   HENT:  Head: Normocephalic and atraumatic.  Mouth/Throat: Oropharynx is clear and moist. No oropharyngeal exudate.  Eyes: Conjunctivae are normal. Right eye exhibits no discharge. Left eye exhibits no discharge. No scleral icterus.  Neck: Normal range of motion. Neck supple.  Cardiovascular:  Normal rate, regular rhythm, normal heart sounds and intact distal pulses.   Pulmonary/Chest: Effort normal and breath sounds normal. No respiratory distress. No wheezes. No rales.  Abdominal: Soft. Bowel sounds are normal. Exhibits no distension and no mass. There is no tenderness.  Musculoskeletal: Normal range of motion. Exhibits no edema.  Lymphadenopathy:    No cervical adenopathy.  Neurological: Alert and oriented to person, place, and time. Exhibits normal muscle tone. Gait normal. Coordination normal.  Skin: Skin is warm and dry. No rash noted. Not diaphoretic. No erythema. No pallor.  Psychiatric: Mood, memory and judgment normal.  Vitals reviewed.   LABORATORY DATA: Lab Results  Component Value Date   WBC 9.5 12/31/2019   HGB 16.0 (H) 12/31/2019   HCT 46.4 (H) 12/31/2019   MCV 90.8 12/31/2019   PLT 216 12/31/2019      Chemistry      Component Value Date/Time   NA 140 12/31/2019 1329   K 4.1 12/31/2019 1329   CL 99 12/31/2019 1329   CO2 30 12/31/2019 1329   BUN 19 12/31/2019 1329   CREATININE 1.20 (H) 12/31/2019 1329      Component Value Date/Time   CALCIUM 10.9 (H) 12/31/2019 1329   ALKPHOS 59 12/31/2019 1329   AST 12 (L) 12/31/2019 1329   ALT 14 12/31/2019 1329   BILITOT 0.3 12/31/2019 1329       RADIOGRAPHIC STUDIES: No results found.  ASSESSMENT: This is a very pleasant 48 year old female referred to the clinic for thrombocytopenia   PLAN: The patient was seen with Dr. Julien Nordmann. The patient had a repeat CBC, CMP, and LDH. Her CBC showed a normal platelet count of 216k.   Dr. Julien Nordmann discussed that her prior thrombocytopenia was likely reactive in nature. He recommends that the patient continue on observation. No further work-up necessary at this point.   We will not schedule any follow up visits at this point and she may continue to have her routine bloodwork performed by her other providers. However, we would be happy to see her in the future for  evaluation if she develops new or worsening thrombocytopenia   Her labs showed hyperglycemia today. She has diabetes. She states that she "fired" her PCP recently who was managing her diabetes. Her BP was also elevated today. She did not take her anti-hypertensive today. We had a lengthy discussion with her today about managing her chronic medical conditions and the importance of having a PCP. We strongly encouraged her to reach out to her PCP or establish care with  another practice for continued management and modification of her DM regimen.   The patient voices understanding of current disease status and treatment options and is in agreement with the current care plan.  All questions were answered. The patient knows to call the clinic with any problems, questions or concerns. We can certainly see the patient much sooner if necessary.  Thank you so much for allowing me to participate in the care of Tina Sloan. I will continue to follow up the patient with you and assist in her care.  The total time spent in the appointment was 60 minutes.  Disclaimer: This note was dictated with voice recognition software. Similar sounding words can inadvertently be transcribed and may not be corrected upon review.   Therese Rocco L Marquist Binstock December 31, 2019, 2:57 PM  ADDENDUM: Hematology/Oncology Attending: I had a face-to-face encounter with the patient today.  I recommended her care plan.  This is a 49 years old white female with multiple medical problems including diabetes mellitus as well as hypertension.  The patient was seen by her gynecologist as well as primary care physician recently for routine evaluation and CBC showed thrombocytopenia on 2 different lab results. The patient also mentions that she has some bruises at that time. She came today with her repeat CBC that showed normal platelets count of 216,000.  She has no other abnormalities on her CBC except for slightly elevated  hemoglobin. The low platelets over the last few times that was seen by her primary care physician or gynecologist was probably drug-induced versus viral infection but RTP could not be completely excluded. The patient is currently feeling fine with no concerning issues.  I recommended for her to continue on observation with routine follow-up visit by her primary care physician and gynecologist at this point.  We will be happy to see her in the future if she has any other abnormalities. She recently fired her primary care physician and I strongly encouraged her to establish care with a new primary care physician for management of her diabetic medication as well as hypertension. The patient was advised to call if she has any concerning hematologic issues.  Disclaimer: This note was dictated with voice recognition software. Similar sounding words can inadvertently be transcribed and may be missed upon review. Eilleen Kempf, MD 12/31/19

## 2019-12-31 ENCOUNTER — Inpatient Hospital Stay: Payer: 59

## 2019-12-31 ENCOUNTER — Other Ambulatory Visit: Payer: Self-pay

## 2019-12-31 ENCOUNTER — Other Ambulatory Visit: Payer: Self-pay | Admitting: Physician Assistant

## 2019-12-31 ENCOUNTER — Inpatient Hospital Stay: Payer: 59 | Attending: Physician Assistant | Admitting: Physician Assistant

## 2019-12-31 ENCOUNTER — Encounter: Payer: Self-pay | Admitting: Physician Assistant

## 2019-12-31 DIAGNOSIS — E119 Type 2 diabetes mellitus without complications: Secondary | ICD-10-CM

## 2019-12-31 DIAGNOSIS — Z87891 Personal history of nicotine dependence: Secondary | ICD-10-CM | POA: Insufficient documentation

## 2019-12-31 DIAGNOSIS — I10 Essential (primary) hypertension: Secondary | ICD-10-CM

## 2019-12-31 DIAGNOSIS — E785 Hyperlipidemia, unspecified: Secondary | ICD-10-CM | POA: Insufficient documentation

## 2019-12-31 DIAGNOSIS — D696 Thrombocytopenia, unspecified: Secondary | ICD-10-CM | POA: Diagnosis not present

## 2019-12-31 DIAGNOSIS — E1165 Type 2 diabetes mellitus with hyperglycemia: Secondary | ICD-10-CM | POA: Diagnosis not present

## 2019-12-31 LAB — CBC WITH DIFFERENTIAL (CANCER CENTER ONLY)
Abs Immature Granulocytes: 0.06 10*3/uL (ref 0.00–0.07)
Basophils Absolute: 0.1 10*3/uL (ref 0.0–0.1)
Basophils Relative: 1 %
Eosinophils Absolute: 0.1 10*3/uL (ref 0.0–0.5)
Eosinophils Relative: 1 %
HCT: 46.4 % — ABNORMAL HIGH (ref 36.0–46.0)
Hemoglobin: 16 g/dL — ABNORMAL HIGH (ref 12.0–15.0)
Immature Granulocytes: 1 %
Lymphocytes Relative: 33 %
Lymphs Abs: 3.2 10*3/uL (ref 0.7–4.0)
MCH: 31.3 pg (ref 26.0–34.0)
MCHC: 34.5 g/dL (ref 30.0–36.0)
MCV: 90.8 fL (ref 80.0–100.0)
Monocytes Absolute: 0.7 10*3/uL (ref 0.1–1.0)
Monocytes Relative: 7 %
Neutro Abs: 5.5 10*3/uL (ref 1.7–7.7)
Neutrophils Relative %: 57 %
Platelet Count: 216 10*3/uL (ref 150–400)
RBC: 5.11 MIL/uL (ref 3.87–5.11)
RDW: 12.5 % (ref 11.5–15.5)
WBC Count: 9.5 10*3/uL (ref 4.0–10.5)
nRBC: 0 % (ref 0.0–0.2)

## 2019-12-31 LAB — CMP (CANCER CENTER ONLY)
ALT: 14 U/L (ref 0–44)
AST: 12 U/L — ABNORMAL LOW (ref 15–41)
Albumin: 4.4 g/dL (ref 3.5–5.0)
Alkaline Phosphatase: 59 U/L (ref 38–126)
Anion gap: 11 (ref 5–15)
BUN: 19 mg/dL (ref 6–20)
CO2: 30 mmol/L (ref 22–32)
Calcium: 10.9 mg/dL — ABNORMAL HIGH (ref 8.9–10.3)
Chloride: 99 mmol/L (ref 98–111)
Creatinine: 1.2 mg/dL — ABNORMAL HIGH (ref 0.44–1.00)
GFR, Estimated: 56 mL/min — ABNORMAL LOW (ref >60)
Glucose, Bld: 346 mg/dL — ABNORMAL HIGH (ref 70–99)
Potassium: 4.1 mmol/L (ref 3.5–5.1)
Sodium: 140 mmol/L (ref 135–145)
Total Bilirubin: 0.3 mg/dL (ref 0.3–1.2)
Total Protein: 7.8 g/dL (ref 6.5–8.1)

## 2019-12-31 LAB — LACTATE DEHYDROGENASE: LDH: 141 U/L (ref 98–192)

## 2020-02-26 ENCOUNTER — Other Ambulatory Visit: Payer: Self-pay | Admitting: Obstetrics and Gynecology

## 2020-02-26 ENCOUNTER — Other Ambulatory Visit: Payer: Self-pay

## 2020-02-26 ENCOUNTER — Ambulatory Visit
Admission: RE | Admit: 2020-02-26 | Discharge: 2020-02-26 | Disposition: A | Discharge status: Home / Self Care | Payer: 59 | Source: Ambulatory Visit | Attending: Obstetrics and Gynecology | Admitting: Obstetrics and Gynecology

## 2020-02-26 DIAGNOSIS — R928 Other abnormal and inconclusive findings on diagnostic imaging of breast: Secondary | ICD-10-CM

## 2020-02-26 DIAGNOSIS — R921 Mammographic calcification found on diagnostic imaging of breast: Secondary | ICD-10-CM

## 2021-02-10 ENCOUNTER — Other Ambulatory Visit: Payer: Self-pay | Admitting: Obstetrics and Gynecology

## 2021-02-10 ENCOUNTER — Ambulatory Visit
Admission: RE | Admit: 2021-02-10 | Discharge: 2021-02-10 | Disposition: A | Discharge status: Home / Self Care | Payer: 59 | Source: Ambulatory Visit | Attending: Obstetrics and Gynecology | Admitting: Obstetrics and Gynecology

## 2021-02-10 DIAGNOSIS — R921 Mammographic calcification found on diagnostic imaging of breast: Secondary | ICD-10-CM
